# Patient Record
Sex: Female | Born: 1995 | Race: White | Hispanic: No | Marital: Single | Smoking: Never smoker
Health system: Southern US, Community
[De-identification: ages and names within clinical notes are randomized; demographics above are authoritative.]

## PROBLEM LIST (undated history)

## (undated) HISTORY — PX: SALPINGECTOMY: SHX328

## (undated) HISTORY — PX: ELBOW SURGERY: SHX618

---

## 2014-08-17 ENCOUNTER — Telehealth: Payer: Self-pay | Admitting: Family Medicine

## 2014-08-26 NOTE — Telephone Encounter (Signed)
Several attempts have been made to contact patient this encounter will be closed.  

## 2014-09-21 ENCOUNTER — Telehealth: Payer: Self-pay | Admitting: Family Medicine

## 2014-09-21 NOTE — Telephone Encounter (Signed)
Patient has Medicaid and mother stated that she thought we were on her Medicaid card. Patient mother advised to call social services to verify and then to call us back to schedule.

## 2014-11-19 ENCOUNTER — Telehealth: Payer: Self-pay | Admitting: Family Medicine

## 2014-11-20 NOTE — Telephone Encounter (Signed)
Patient has medicaid and we are on patients medicaid card. Patient will call us back to schedule once she gets her schedule.

## 2014-12-18 ENCOUNTER — Telehealth: Payer: Self-pay | Admitting: Family Medicine

## 2014-12-21 NOTE — Telephone Encounter (Signed)
Appointment scheduled for May 16 with Christy @ 9:10am. Patient aware to arrive 20 minutes before appointment and to make sure she brings insurance card, photo ID and and medication she may be on.

## 2015-01-25 ENCOUNTER — Ambulatory Visit (INDEPENDENT_AMBULATORY_CARE_PROVIDER_SITE_OTHER): Payer: Medicaid Other | Admitting: Family

## 2015-01-25 ENCOUNTER — Encounter (INDEPENDENT_AMBULATORY_CARE_PROVIDER_SITE_OTHER): Payer: Self-pay

## 2015-01-25 ENCOUNTER — Encounter: Payer: Self-pay | Admitting: Family

## 2015-01-25 VITALS — BP 106/71 | HR 65 | Temp 97.4°F | Ht 67.0 in | Wt 122.8 lb

## 2015-01-25 DIAGNOSIS — R110 Nausea: Secondary | ICD-10-CM | POA: Diagnosis not present

## 2015-01-25 DIAGNOSIS — M5441 Lumbago with sciatica, right side: Secondary | ICD-10-CM

## 2015-01-25 MED ORDER — ONDANSETRON HCL 4 MG PO TABS: 4.0000 mg | ORAL_TABLET | Freq: Three times a day (TID) | ORAL | Status: DC | PRN

## 2015-01-25 MED ORDER — OMEPRAZOLE 20 MG PO CPDR: 20.0000 mg | DELAYED_RELEASE_CAPSULE | Freq: Every day | ORAL | Status: DC

## 2015-01-25 MED ORDER — MELOXICAM 15 MG PO TABS: 15.0000 mg | ORAL_TABLET | Freq: Every day | ORAL | Status: DC

## 2015-01-25 NOTE — Progress Notes (Signed)
Subjective:    Patient ID: Jennifer Key, female    DOB: 02-14-1996, 19 y.o.   MRN: [id number]  Pt presents to the office today to establish care and discuss: Back Pain This is a new problem. The current episode started more than 1 year ago. The problem occurs intermittently. The problem has been waxing and waning since onset. The pain is present in the thoracic spine and lumbar spine. The quality of the pain is described as aching. The pain radiates to the right thigh and right foot. The pain is at a severity of 10/10. The pain is moderate. The symptoms are aggravated by bending and standing. Associated symptoms include leg pain. Pertinent negatives include no bladder incontinence, bowel incontinence, dysuria, headaches, numbness or tingling. She has tried ice and NSAIDs for the symptoms. The treatment provided mild relief.   Pt is complaining of intermittent nausea that has been going on for "years". Pt states it seems to be getting worse. Pt states it seems worse the in the morning and when laying down. Pt states getting up and moving around seems to help. Pt denies any heartburn.     Review of Systems  Constitutional: Negative.   HENT: Negative.   Eyes: Negative.   Respiratory: Negative.  Negative for shortness of breath.   Cardiovascular: Negative.  Negative for palpitations.  Gastrointestinal: Negative.  Negative for bowel incontinence.  Endocrine: Negative.   Genitourinary: Negative.  Negative for bladder incontinence and dysuria.  Musculoskeletal: Positive for back pain.  Neurological: Negative.  Negative for tingling, numbness and headaches.  Hematological: Negative.   Psychiatric/Behavioral: Negative.   All other systems reviewed and are negative.      Objective:   Physical Exam  Constitutional: She is oriented to person, place, and time. She appears well-developed and well-nourished. No distress.  HENT:  Head: Normocephalic and atraumatic.  Right Ear: External ear  normal.  Left Ear: External ear normal.  Nose: Nose normal.  Mouth/Throat: Oropharynx is clear and moist.  Eyes: Pupils are equal, round, and reactive to light.  Neck: Normal range of motion. Neck supple. No thyromegaly present.  Cardiovascular: Normal rate, regular rhythm, normal heart sounds and intact distal pulses.   No murmur heard. Pulmonary/Chest: Effort normal and breath sounds normal. No respiratory distress. She has no wheezes.  Abdominal: Soft. Bowel sounds are normal. She exhibits no distension. There is no tenderness.  Musculoskeletal: Normal range of motion. She exhibits no edema or tenderness.  Neurological: She is alert and oriented to person, place, and time. She has normal reflexes. No cranial nerve deficit.  Skin: Skin is warm and dry.  Psychiatric: She has a normal mood and affect. Her behavior is normal. Judgment and thought content normal.  Vitals reviewed.     BP 106/71 mmHg  Pulse 65  Temp(Src) 97.4 F (36.3 C) (Oral)  Ht _0  (1.702 m)  Wt 122 lb 12.8 oz (55.702 kg)  BMI 19.23 kg/m2     Assessment & Plan:  1. Nausea without vomiting -Avoid spicy foods -Use zofran as needed -RTO in 2 weeks- May need to put pt on long term med- Reglan? - ondansetron (ZOFRAN) 4 MG tablet; Take 1 tablet (4 mg total) by mouth every 8 (eight) hours as needed for nausea or vomiting.  Dispense: 20 tablet; Refill: 0 - omeprazole (PRILOSEC) 20 MG capsule; Take 1 capsule (20 mg total) by mouth daily.  Dispense: 30 capsule; Refill: 3 - CMP14+EGFR - H Pylori, IGM, IGG, IGA AB  2.  Right-sided low back pain with right-sided sciatica -Rest -ice -No other NSAIDs - meloxicam (MOBIC) 15 MG tablet; Take 1 tablet (15 mg total) by mouth daily.  Dispense: 30 tablet; Refill: 0 - Ambulatory referral to Chiropractic - CMP14+EGFR   Continue all meds Labs pending Health Maintenance reviewed Diet and exercise encouraged RTO 2 weeks  Evelina Dun, FNP

## 2015-01-25 NOTE — Patient Instructions (Signed)
Nausea, Adult Nausea is the feeling that you have an upset stomach or have to vomit. Nausea by itself is not likely a serious concern, but it may be an early sign of more serious medical problems. As nausea gets worse, it can lead to vomiting. If vomiting develops, there is the risk of dehydration.  CAUSES   Viral infections.  Food poisoning.  Medicines.  Pregnancy.  Motion sickness.  Migraine headaches.  Emotional distress.  Severe pain from any source.  Alcohol intoxication. HOME CARE INSTRUCTIONS  Get plenty of rest.  Ask your caregiver about specific rehydration instructions.  Eat small amounts of food and sip liquids more often.  Take all medicines as told by your caregiver. SEEK MEDICAL CARE IF:  You have not improved after 2 days, or you get worse.  You have a headache. SEEK IMMEDIATE MEDICAL CARE IF:   You have a fever.  You faint.  You keep vomiting or have blood in your vomit.  You are extremely weak or dehydrated.  You have dark or bloody stools.  You have severe chest or abdominal pain. MAKE SURE YOU:  Understand these instructions.  Will watch your condition.  Will get help right away if you are not doing well or get worse. Document Released: 10/05/2004 Document Revised: 05/22/2012 Document Reviewed: 05/10/2011 [facility]ExitCare Patient Information 2015 [place], [place]. This information is not intended to replace advice given to you by your health care provider. Make sure you discuss any questions you have with your health care provider. Back Pain, Adult Low back pain is very common. About 1 in 5 people have back pain.The cause of low back pain is rarely dangerous. The pain often gets better over time.About half of people with a sudden onset of back pain feel better in just 2 weeks. About 8 in 10 people feel better by 6 weeks.  CAUSES Some common causes of back pain include:  Strain of the muscles or ligaments supporting the spine.  Wear and tear  (degeneration) of the spinal discs.  Arthritis.  Direct injury to the back. DIAGNOSIS Most of the time, the direct cause of low back pain is not known.However, back pain can be treated effectively even when the exact cause of the pain is unknown.Answering your caregiver's questions about your overall health and symptoms is one of the most accurate ways to make sure the cause of your pain is not dangerous. If your caregiver needs more information, he or she may order lab work or imaging tests (X-rays or MRIs).However, even if imaging tests show changes in your back, this usually does not require surgery. HOME CARE INSTRUCTIONS For many people, back pain returns.Since low back pain is rarely dangerous, it is often a condition that people can learn to [facility]manageon their own.   Remain active. It is stressful on the back to sit or stand in one place. Do not sit, drive, or stand in one place for more than 30 minutes at a time. Take short walks on level surfaces as soon as pain allows.Try to increase the length of time you walk each day.  Do not stay in bed.Resting more than 1 or 2 days can delay your recovery.  Do not avoid exercise or work.Your body is made to move.It is not dangerous to be active, even though your back may hurt.Your back will likely heal faster if you return to being active before your pain is gone.  Pay attention to your body when you bend and lift. Many people have  less discomfortwhen lifting if they bend their knees, keep the load close to their bodies,and avoid twisting. Often, the most comfortable positions are those that put less stress on your recovering back.  Find a comfortable position to sleep. Use a firm mattress and lie on your side with your knees slightly bent. If you lie on your back, put a pillow under your knees.  Only take over-the-counter or prescription medicines as directed by your caregiver. Over-the-counter medicines to reduce pain and inflammation  are often the most helpful.Your caregiver may prescribe muscle relaxant drugs.These medicines help dull your pain so you can more quickly return to your normal activities and healthy exercise.  Put ice on the injured area.  Put ice in a plastic bag.  Place a towel between your skin and the bag.  Leave the ice on for 15-20 minutes, 03-04 times a day for the first 2 to 3 days. After that, ice and heat may be alternated to reduce pain and spasms.  Ask your caregiver about trying back exercises and gentle massage. This may be of some benefit.  Avoid feeling anxious or stressed.Stress increases muscle tension and can worsen back pain.It is important to recognize when you are anxious or stressed and learn ways to manage it.Exercise is a great option. SEEK MEDICAL CARE IF:  You have pain that is not relieved with rest or medicine.  You have pain that does not improve in 1 week.  You have new symptoms.  You are generally not feeling well. SEEK IMMEDIATE MEDICAL CARE IF:   You have pain that radiates from your back into your legs.  You develop new bowel or bladder control problems.  You have unusual weakness or numbness in your arms or legs.  You develop nausea or vomiting.  You develop abdominal pain.  You feel faint. Document Released: 08/28/2005 Document Revised: 02/27/2012 Document Reviewed: 12/30/2013 [facility] Patient Information 2015 [place], [place]. This information is not intended to replace advice given to you by your health care provider. Make sure you discuss any questions you have with your health care provider.

## 2015-01-27 LAB — CMP14+EGFR
A/G RATIO: 1.9 (ref 1.1–2.5)
ALBUMIN: 5 g/dL (ref 3.5–5.5)
ALK PHOS: 75 IU/L (ref 39–117)
ALT: 11 IU/L (ref 0–32)
AST: 15 IU/L (ref 0–40)
BILIRUBIN TOTAL: 0.5 mg/dL (ref 0.0–1.2)
BUN/Creatinine Ratio: 16 (ref 8–20)
BUN: 14 mg/dL (ref 6–20)
CALCIUM: 10.4 mg/dL — AB (ref 8.7–10.2)
CHLORIDE: 102 mmol/L (ref 97–108)
CO2: 25 mmol/L (ref 18–29)
CREATININE: 0.88 mg/dL (ref 0.57–1.00)
GFR calc Af Amer: 110 mL/min/{1.73_m2} (ref >59)
GFR calc non Af Amer: 96 mL/min/{1.73_m2} (ref >59)
GLOBULIN, TOTAL: 2.7 g/dL (ref 1.5–4.5)
GLUCOSE: 90 mg/dL (ref 65–99)
Potassium: 5 mmol/L (ref 3.5–5.2)
Sodium: 145 mmol/L — ABNORMAL HIGH (ref 134–144)
TOTAL PROTEIN: 7.7 g/dL (ref 6.0–8.5)

## 2015-01-27 LAB — H PYLORI, IGM, IGG, IGA AB
H Pylori IgG: <0.9 U/mL (ref 0.0–0.8)
H. pylori, IgA Abs: <9 units (ref 0.0–8.9)

## 2015-02-16 ENCOUNTER — Ambulatory Visit: Payer: Medicaid Other | Admitting: Family

## 2015-03-29 ENCOUNTER — Encounter: Payer: Self-pay | Admitting: Family Medicine

## 2015-03-29 ENCOUNTER — Encounter: Payer: Self-pay | Admitting: *Deleted

## 2015-03-29 ENCOUNTER — Ambulatory Visit (INDEPENDENT_AMBULATORY_CARE_PROVIDER_SITE_OTHER): Payer: Medicaid Other | Admitting: Family Medicine

## 2015-03-29 VITALS — BP 124/84 | HR 89 | Temp 98.2°F | Ht 67.01 in | Wt 128.0 lb

## 2015-03-29 DIAGNOSIS — L500 Allergic urticaria: Secondary | ICD-10-CM

## 2015-03-29 MED ORDER — PREDNISONE 10 MG PO TABS: ORAL_TABLET | ORAL | Status: DC

## 2015-03-29 NOTE — Progress Notes (Signed)
Subjective:    Patient ID: Jennifer Key, female    DOB: 1996-08-07, 19 y.o.   MRN: [id number]  HPI  Patient here today for a rash that was first noticed yesterday morning. The rash itches and burns. There are no known exposure to allergens and it is confined to her anterior abdomen from the waist to just below the breasts. It is bilateral      There are no active problems to display for this patient.  Outpatient Encounter Prescriptions as of 03/29/2015  Medication Sig  . ondansetron (ZOFRAN) 4 MG tablet Take 1 tablet (4 mg total) by mouth every 8 (eight) hours as needed for nausea or vomiting.  . [DISCONTINUED] meloxicam (MOBIC) 15 MG tablet Take 1 tablet (15 mg total) by mouth daily.  . [DISCONTINUED] omeprazole (PRILOSEC) 20 MG capsule Take 1 capsule (20 mg total) by mouth daily.   No facility-administered encounter medications on file as of 03/29/2015.      Review of Systems  Constitutional: Negative.   HENT: Negative.   Eyes: Negative.   Respiratory: Negative.   Cardiovascular: Negative.   Gastrointestinal: Negative.   Endocrine: Negative.   Genitourinary: Negative.   Musculoskeletal: Negative.   Skin: Positive for rash (itches and burns).  Allergic/Immunologic: Negative.   Neurological: Negative.   Hematological: Negative.   Psychiatric/Behavioral: Negative.        Objective:   Physical Exam  Skin: There is erythema.  Rash is generalized erythema on both sides of the abdomen with some urticarial lesions throughout the confluent erythema. It does blanch with pressure. It is warm to touch. There is no rash elsewhere on the body   BP 124/84 mmHg  Pulse 89  Temp(Src) 98.2 F (36.8 C) (Oral)  Ht 5' 7.01" (1.702 m)  Wt 128 lb (58.06 kg)  BMI 20.04 kg/m2  LMP 03/22/2015        Assessment & Plan:  1. Allergic urticaria Unknown allergen will treat with anti-histamine. I recommended Zyrtec once a day and will also prescribe prednisone with a taper dose 40,  30, 20, 10 mg on consecutive days. She is to let us know if rash worsens or does not resolve  Frederica Kuster MD

## 2015-03-29 NOTE — Patient Instructions (Signed)
Take prednisone as directed  Call us back if you are not seeing improvement in the next few days.

## 2015-04-02 ENCOUNTER — Ambulatory Visit (INDEPENDENT_AMBULATORY_CARE_PROVIDER_SITE_OTHER): Payer: Medicaid Other | Admitting: Family Medicine

## 2015-04-02 ENCOUNTER — Encounter: Payer: Self-pay | Admitting: Family Medicine

## 2015-04-02 VITALS — BP 114/77 | HR 83 | Temp 97.5°F | Ht 67.0 in | Wt 125.2 lb

## 2015-04-02 DIAGNOSIS — R300 Dysuria: Secondary | ICD-10-CM | POA: Diagnosis not present

## 2015-04-02 DIAGNOSIS — Z30011 Encounter for initial prescription of contraceptive pills: Secondary | ICD-10-CM

## 2015-04-02 LAB — POCT UA - MICROSCOPIC ONLY
Casts, Ur, LPF, POC: NEGATIVE
Crystals, Ur, HPF, POC: NEGATIVE
Mucus, UA: NEGATIVE
YEAST UA: NEGATIVE

## 2015-04-02 LAB — POCT URINALYSIS DIPSTICK
BILIRUBIN UA: NEGATIVE
Glucose, UA: NEGATIVE
Ketones, UA: NEGATIVE
Nitrite, UA: NEGATIVE
PROTEIN UA: NEGATIVE
Spec Grav, UA: 1.015
UROBILINOGEN UA: NEGATIVE
pH, UA: 5

## 2015-04-02 LAB — POCT URINE PREGNANCY: PREG TEST UR: NEGATIVE

## 2015-04-02 MED ORDER — NITROFURANTOIN MACROCRYSTAL 100 MG PO CAPS: 100.0000 mg | ORAL_CAPSULE | Freq: Four times a day (QID) | ORAL | Status: DC

## 2015-04-02 MED ORDER — LEVONORG-ETH ESTRAD TRIPHASIC PO TABS: 1.0000 | ORAL_TABLET | Freq: Every day | ORAL | Status: DC

## 2015-04-02 NOTE — Progress Notes (Signed)
Subjective:  Patient ID: Jennifer Key, female    DOB: 1995-12-31  Age: 19 y.o. MRN: [id number]  CC: Urinary Tract Infection and Contraception   HPI Jennifer Key presents for pressure, dysuria. Onset 4 days ago. Nausea increasing. Last Menses 7/13. Typical flow.   INtercourse 3 days ago.   Key Jennifer Key on file.   She has past surgical Key that includes Elbow surgery (Left).   Her family Key is not on file.She reports that she has never smoked. She does not have any smokeless tobacco Key on file. She reports that she does not drink alcohol or use illicit drugs.  Outpatient Prescriptions Prior to Visit  Medication Sig Dispense Refill  . ondansetron (ZOFRAN) 4 MG tablet Take 1 tablet (4 mg total) by mouth every 8 (eight) hours as needed for nausea or vomiting. 20 tablet 0  . predniSONE (DELTASONE) 10 MG tablet TAPER- 1 tab by mouth four times daily for 1 day, 1 tab by mouth three times daily for 1 day, 1 tab by mouth twice a day for 1 day, then 1 tab by mouth daily for 1 day, then stop. (Patient not taking: Reported on 04/02/2015) 10 tablet 0   No facility-administered medications prior to visit.    ROS Review of Systems  Constitutional: Negative for fever, chills, diaphoresis, appetite change and fatigue.  HENT: Negative for congestion, ear pain, hearing loss, postnasal drip, rhinorrhea, sore throat and trouble swallowing.   Respiratory: Negative for cough, chest tightness and shortness of breath.   Cardiovascular: Negative for chest pain and palpitations.  Gastrointestinal: Negative for abdominal pain.  Genitourinary: Positive for dysuria and frequency.  Musculoskeletal: Negative for arthralgias.  Skin: Negative for rash.    Objective:  BP 114/77 mmHg  Pulse 83  Temp(Src) 97.5 F (36.4 C) (Oral)  Ht 5\' 7"  (1.702 m)  Wt 125 lb 3.2 oz (56.79 kg)  BMI 19.60 kg/m2  LMP 03/22/2015  BP Readings from Last 3 Encounters:    04/02/15 114/77  03/29/15 124/84  01/25/15 106/71    Wt Readings from Last 3 Encounters:  04/02/15 125 lb 3.2 oz (56.79 kg) (45 %*, Z = -0.13)  03/29/15 128 lb (58.06 kg) (50 %*, Z = 0.00)  01/25/15 122 lb 12.8 oz (55.702 kg) (41 %*, Z = -0.24)   * Growth percentiles are based on CDC 2-20 Years data.     Physical Exam  Constitutional: She is oriented to person, place, and time. She appears well-developed and well-nourished. No distress.  HENT:  Head: Normocephalic and atraumatic.  Eyes: Conjunctivae are normal. Pupils are equal, round, and reactive to light.  Neck: Normal range of motion. Neck supple. No thyromegaly present.  Cardiovascular: Normal rate, regular rhythm and normal heart sounds.   No murmur heard. Pulmonary/Chest: Effort normal and breath sounds normal. No respiratory distress. She has no wheezes. She has no rales.  Abdominal: Soft. Bowel sounds are normal. She exhibits no distension. There is no tenderness.  Musculoskeletal: Normal range of motion.  Lymphadenopathy:    She has no cervical adenopathy.  Neurological: She is alert and oriented to person, place, and time.  Skin: Skin is warm and dry.  Psychiatric: She has a normal mood and affect. Her behavior is normal. Judgment and thought content normal.    No results found for: HGBA1C  Lab Results  Component Value Date   GLUCOSE 90 01/25/2015   ALT 11 01/25/2015   AST 15 01/25/2015   NA 145*  01/25/2015   K 5.0 01/25/2015   CL 102 01/25/2015   CREATININE 0.88 01/25/2015   BUN 14 01/25/2015   CO2 25 01/25/2015    Patient was never admitted.  Assessment & Plan:   Jennifer Key was seen today for urinary tract infection and contraception.  Diagnoses and all orders for this visit:  Dysuria Orders: -     POCT urinalysis dipstick -     POCT UA - Microscopic Only -     Urine culture -     GC/Chlamydia Probe Amp -     Hepatitis C antibody -     HIV antibody -     RPR  Encounter for initial  prescription of contraceptive pills Orders: -     POCT urine pregnancy -     GC/Chlamydia Probe Amp -     Hepatitis C antibody -     HIV antibody -     RPR  Other orders -     levonorgestrel-ethinyl estradiol (ENPRESSE,TRIVORA) tablet; Take 1 tablet by mouth daily. -     nitrofurantoin (MACRODANTIN) 100 MG capsule; Take 1 capsule (100 mg total) by mouth 4 (four) times daily.   I have discontinued Jennifer Key's predniSONE. I am also having her start on levonorgestrel-ethinyl estradiol and nitrofurantoin. Additionally, I am having her maintain her ondansetron.  Meds ordered this encounter  Medications  . levonorgestrel-ethinyl estradiol (ENPRESSE,TRIVORA) tablet    Sig: Take 1 tablet by mouth daily.    Dispense:  1 Package    Refill:  2  . nitrofurantoin (MACRODANTIN) 100 MG capsule    Sig: Take 1 capsule (100 mg total) by mouth 4 (four) times daily.    Dispense:  14 capsule    Refill:  0     Follow-up: Return in about 3 months (around 07/03/2015).  Mechele Claude, M.D.

## 2015-04-03 LAB — RPR: RPR: NONREACTIVE

## 2015-04-03 LAB — HEPATITIS C ANTIBODY

## 2015-04-03 LAB — HIV ANTIBODY (ROUTINE TESTING W REFLEX): HIV Screen 4th Generation wRfx: NONREACTIVE

## 2015-04-04 LAB — URINE CULTURE

## 2015-04-05 ENCOUNTER — Telehealth: Payer: Self-pay | Admitting: *Deleted

## 2015-04-05 NOTE — Telephone Encounter (Signed)
-----   Message from Mechele Claude, MD sent at 04/04/2015  8:55 PM EDT ----- Deland Pretty, Your urine culture grew a common germ that should respond well to the antibiotic he were prescribed. Best Regards, Mechele Claude, M.D.

## 2015-04-05 NOTE — Telephone Encounter (Signed)
Pt notified of results Verbalizes understanding 

## 2015-04-06 LAB — GC/CHLAMYDIA PROBE AMP
CHLAMYDIA, DNA PROBE: NEGATIVE
NEISSERIA GONORRHOEAE BY PCR: NEGATIVE

## 2015-04-07 ENCOUNTER — Ambulatory Visit (INDEPENDENT_AMBULATORY_CARE_PROVIDER_SITE_OTHER): Payer: Medicaid Other | Admitting: Family

## 2015-04-07 ENCOUNTER — Encounter: Payer: Self-pay | Admitting: Family

## 2015-04-07 VITALS — BP 112/71 | HR 69 | Temp 97.5°F | Ht 67.0 in | Wt 122.0 lb

## 2015-04-07 DIAGNOSIS — R112 Nausea with vomiting, unspecified: Secondary | ICD-10-CM | POA: Diagnosis not present

## 2015-04-07 NOTE — Patient Instructions (Signed)

## 2015-04-07 NOTE — Progress Notes (Signed)
   Subjective:    Patient ID: Jennifer Key, female    DOB: [birth year], 19 y.o.   MRN: [id number]  Emesis  This is a new problem. The current episode started today. The problem occurs 5 to 10 times per day. The problem has been rapidly improving. There has been no fever. Associated symptoms include chills and sweats. Pertinent negatives include no arthralgias, coughing, fever or headaches. Treatments tried: zofran. The treatment provided mild relief.      Review of Systems  Constitutional: Positive for chills. Negative for fever.  HENT: Negative.   Eyes: Negative.   Respiratory: Negative.  Negative for cough and shortness of breath.   Cardiovascular: Negative.  Negative for palpitations.  Gastrointestinal: Positive for vomiting.  Endocrine: Negative.   Genitourinary: Negative.   Musculoskeletal: Negative.  Negative for arthralgias.  Neurological: Negative.  Negative for headaches.  Hematological: Negative.   Psychiatric/Behavioral: Negative.   All other systems reviewed and are negative.      Objective:   Physical Exam  Constitutional: She is oriented to person, place, and time. She appears well-developed and well-nourished. No distress.  HENT:  Head: Normocephalic and atraumatic.  Eyes: Pupils are equal, round, and reactive to light.  Neck: Normal range of motion. Neck supple. No thyromegaly present.  Cardiovascular: Normal rate, regular rhythm, normal heart sounds and intact distal pulses.   No murmur heard. Pulmonary/Chest: Effort normal and breath sounds normal. No respiratory distress. She has no wheezes.  Abdominal: Soft. Bowel sounds are normal. She exhibits no distension. There is no tenderness.  Musculoskeletal: Normal range of motion. She exhibits no edema or tenderness.  Neurological: She is alert and oriented to person, place, and time. She has normal reflexes. No cranial nerve deficit.  Skin: Skin is warm and dry.  Psychiatric: She has a normal mood and affect.  Her behavior is normal. Judgment and thought content normal.  Vitals reviewed.     BP 112/71 mmHg  Pulse 69  Temp(Src) 97.5 F (36.4 C) (Oral)  Ht  (1.702 m)  Wt 122 lb (55.339 kg)  BMI 19.10 kg/m2  LMP 03/22/2015     Assessment & Plan:  1. Non-intractable vomiting with nausea, vomiting of unspecified type -Use zofran as needed -Force fluids -Bland diet -PT states she is "much better at this time" -RTO as needed or if s/s worsen  Jannifer Rodney, FNP

## 2015-04-08 ENCOUNTER — Encounter: Payer: Self-pay | Admitting: Family Medicine

## 2015-04-08 ENCOUNTER — Ambulatory Visit (INDEPENDENT_AMBULATORY_CARE_PROVIDER_SITE_OTHER): Payer: Medicaid Other | Admitting: Family Medicine

## 2015-04-08 VITALS — BP 116/73 | HR 68 | Temp 98.2°F | Ht 67.0 in | Wt 123.8 lb

## 2015-04-08 DIAGNOSIS — N898 Other specified noninflammatory disorders of vagina: Secondary | ICD-10-CM

## 2015-04-08 DIAGNOSIS — N760 Acute vaginitis: Secondary | ICD-10-CM

## 2015-04-08 DIAGNOSIS — A499 Bacterial infection, unspecified: Secondary | ICD-10-CM

## 2015-04-08 DIAGNOSIS — R112 Nausea with vomiting, unspecified: Secondary | ICD-10-CM

## 2015-04-08 DIAGNOSIS — B9689 Other specified bacterial agents as the cause of diseases classified elsewhere: Secondary | ICD-10-CM

## 2015-04-08 LAB — POCT WET PREP (WET MOUNT): KOH Wet Prep POC: POSITIVE

## 2015-04-08 NOTE — Progress Notes (Signed)
Subjective:  Patient ID: Jennifer Key, female    DOB: 1995-12-11  Age: 19 y.o. MRN: [id number]  CC: nausea with vomiting and vaginal irritation   HPI Jennifer Key presents for onset 3 days ago. Not on menses. Hasn't started OC yet. Sexually active last week. Mild vaginal DC. No odor  History Jennifer Key has no past medical history on file.   She has past surgical history that includes Elbow surgery (Left).   Her family history is not on file.She reports that she has never smoked. She does not have any smokeless tobacco history on file. She reports that she does not drink alcohol or use illicit drugs.  Outpatient Prescriptions Prior to Visit  Medication Sig Dispense Refill  . ondansetron (ZOFRAN) 4 MG tablet Take 1 tablet (4 mg total) by mouth every 8 (eight) hours as needed for nausea or vomiting. 20 tablet 0  . levonorgestrel-ethinyl estradiol (ENPRESSE,TRIVORA) tablet Take 1 tablet by mouth daily. (Patient not taking: Reported on 04/07/2015) 1 Package 2   No facility-administered medications prior to visit.    ROS Review of Systems  Constitutional: Negative for fever, chills, diaphoresis, appetite change, fatigue and unexpected weight change.  HENT: Negative for congestion, ear pain, hearing loss, postnasal drip, rhinorrhea, sneezing, sore throat and trouble swallowing.   Eyes: Negative for pain.  Respiratory: Negative for cough, chest tightness and shortness of breath.   Cardiovascular: Negative for chest pain and palpitations.  Gastrointestinal: Positive for nausea and vomiting. Negative for abdominal pain, diarrhea and constipation.  Genitourinary: Positive for vaginal discharge and vaginal pain. Negative for dysuria and frequency.  Musculoskeletal: Negative for joint swelling and arthralgias.  Skin: Negative for rash.  Neurological: Negative for dizziness, weakness, numbness and headaches.  Psychiatric/Behavioral: Negative for dysphoric mood and agitation.     Objective:  BP 116/73 mmHg  Pulse 68  Temp(Src) 98.2 F (36.8 C) (Oral)  Ht 5\' 7"  (1.702 m)  Wt 123 lb 12.8 oz (56.155 kg)  BMI 19.39 kg/m2  LMP 03/22/2015  BP Readings from Last 3 Encounters:  04/08/15 116/73  04/07/15 112/71  04/02/15 114/77    Wt Readings from Last 3 Encounters:  04/08/15 123 lb 12.8 oz (56.155 kg) (42 %*, Z = -0.20)  04/07/15 122 lb (55.339 kg) (38 %*, Z = -0.30)  04/02/15 125 lb 3.2 oz (56.79 kg) (45 %*, Z = -0.13)   * Growth percentiles are based on CDC 2-20 Years data.     Physical Exam  Constitutional: She is oriented to person, place, and time. She appears well-developed and well-nourished. No distress.  HENT:  Head: Normocephalic and atraumatic.  Right Ear: External ear normal.  Left Ear: External ear normal.  Nose: Nose normal.  Mouth/Throat: Oropharynx is clear and moist.  Eyes: Conjunctivae and EOM are normal. Pupils are equal, round, and reactive to light.  Neck: Normal range of motion. Neck supple. No thyromegaly present.  Cardiovascular: Normal rate, regular rhythm and normal heart sounds.   No murmur heard. Pulmonary/Chest: Effort normal and breath sounds normal. No respiratory distress. She has no wheezes. She has no rales.  Abdominal: Soft. Bowel sounds are normal. She exhibits no distension. There is no tenderness.  Lymphadenopathy:    She has no cervical adenopathy.  Neurological: She is alert and oriented to person, place, and time. She has normal reflexes.  Skin: Skin is warm and dry.  Psychiatric: She has a normal mood and affect. Her behavior is normal. Judgment and thought content normal.    No  results found for: HGBA1C  Lab Results  Component Value Date   GLUCOSE 90 01/25/2015   ALT 11 01/25/2015   AST 15 01/25/2015   NA 145* 01/25/2015   K 5.0 01/25/2015   CL 102 01/25/2015   CREATININE 0.88 01/25/2015   BUN 14 01/25/2015   CO2 25 01/25/2015    Patient was never admitted.  Assessment & Plan:    Jennifer Key was seen today for nausea with vomiting and vaginal irritation.  Diagnoses and all orders for this visit:  BV (bacterial vaginosis)  Nausea and vomiting, vomiting of unspecified type Orders: -     POCT UA - Microscopic Only -     POCT urinalysis dipstick -     Cancel: POCT urine pregnancy -     hCG, quantitative, pregnancy -     hCG, quantitative, pregnancy -     Ambulatory referral to Gastroenterology  Vaginal irritation Orders: -     POCT UA - Microscopic Only -     POCT urinalysis dipstick -     POCT Wet Prep ([place]et [place]) -     hCG, quantitative, pregnancy  Other orders -     clindamycin (CLEOCIN) 100 MG vaginal suppository; Place 1 suppository (100 mg total) vaginally at bedtime.   I am having Jennifer Key start on clindamycin. I am also having her maintain her ondansetron and levonorgestrel-ethinyl estradiol.  Meds ordered this encounter  Medications  . clindamycin (CLEOCIN) 100 MG vaginal suppository    Sig: Place 1 suppository (100 mg total) vaginally at bedtime.    Dispense:  3 suppository    Refill:  0     Follow-up: No Follow-up on file.  Mechele Claude, M.D.

## 2015-04-09 ENCOUNTER — Encounter: Payer: Self-pay | Admitting: Physician Assistant

## 2015-04-09 DIAGNOSIS — B9689 Other specified bacterial agents as the cause of diseases classified elsewhere: Secondary | ICD-10-CM | POA: Insufficient documentation

## 2015-04-09 DIAGNOSIS — N760 Acute vaginitis: Principal | ICD-10-CM | POA: Insufficient documentation

## 2015-04-09 LAB — BETA HCG QUANT (REF LAB): hCG Quant: <1 m[IU]/mL

## 2015-04-09 MED ORDER — CLINDAMYCIN PHOSPHATE 100 MG VA SUPP: 100.0000 mg | Freq: Every day | VAGINAL | Status: DC

## 2015-04-12 ENCOUNTER — Ambulatory Visit (INDEPENDENT_AMBULATORY_CARE_PROVIDER_SITE_OTHER): Payer: Medicaid Other | Admitting: Family

## 2015-04-12 ENCOUNTER — Encounter: Payer: Self-pay | Admitting: Family

## 2015-04-12 VITALS — BP 113/70 | HR 79 | Temp 97.6°F | Ht 67.0 in | Wt 123.8 lb

## 2015-04-12 DIAGNOSIS — R300 Dysuria: Secondary | ICD-10-CM | POA: Diagnosis not present

## 2015-04-12 DIAGNOSIS — N3000 Acute cystitis without hematuria: Secondary | ICD-10-CM | POA: Diagnosis not present

## 2015-04-12 LAB — POCT URINALYSIS DIPSTICK
BILIRUBIN UA: NEGATIVE
Glucose, UA: NEGATIVE
KETONES UA: NEGATIVE
Nitrite, UA: POSITIVE
PROTEIN UA: NEGATIVE
Spec Grav, UA: 1.015
UROBILINOGEN UA: NEGATIVE
pH, UA: 6.5

## 2015-04-12 LAB — POCT UA - MICROSCOPIC ONLY
CASTS, UR, LPF, POC: NEGATIVE
Crystals, Ur, HPF, POC: NEGATIVE
Yeast, UA: NEGATIVE

## 2015-04-12 MED ORDER — SULFAMETHOXAZOLE-TRIMETHOPRIM 800-160 MG PO TABS: 1.0000 | ORAL_TABLET | Freq: Two times a day (BID) | ORAL | Status: DC

## 2015-04-12 NOTE — Progress Notes (Signed)
   Subjective:    Patient ID: Jennifer Key, female    DOB: [birth year], 19 y.o.   MRN: [id number]  Flank Pain This is a new problem. The current episode started yesterday (Sunday). The problem occurs constantly. The problem is unchanged. The quality of the pain is described as burning. Associated symptoms include dysuria. Pertinent negatives include no fever or headaches.  Emesis  This is a recurrent problem. The current episode started today. The problem occurs less than 2 times per day. The emesis has an appearance of stomach contents. Pertinent negatives include no arthralgias, chills, diarrhea, dizziness, fever or headaches. She has tried nothing for the symptoms. The treatment provided mild relief.  Dysuria  This is a new problem. The current episode started in the past 7 days (Sunday). The problem occurs every urination. The problem has been unchanged. The pain is at a severity of 6/10. The pain is mild. Associated symptoms include flank pain, nausea and vomiting. Pertinent negatives include no chills or hematuria. She has tried nothing for the symptoms. The treatment provided no relief.      Review of Systems  Constitutional: Negative.  Negative for fever and chills.  HENT: Negative.   Eyes: Negative.   Respiratory: Negative.  Negative for shortness of breath.   Cardiovascular: Negative.  Negative for palpitations.  Gastrointestinal: Positive for nausea and vomiting. Negative for diarrhea.  Endocrine: Negative.   Genitourinary: Positive for dysuria and flank pain. Negative for hematuria.  Musculoskeletal: Negative.  Negative for arthralgias.  Neurological: Negative.  Negative for dizziness and headaches.  Hematological: Negative.   Psychiatric/Behavioral: Negative.   All other systems reviewed and are negative.      Objective:   Physical Exam  Constitutional: She is oriented to person, place, and time. She appears well-developed and well-nourished. No distress.  HENT:    Head: Normocephalic and atraumatic.  Right Ear: External ear normal.  Left Ear: External ear normal.  Nose: Nose normal.  Mouth/Throat: Oropharynx is clear and moist.  Eyes: Pupils are equal, round, and reactive to light.  Neck: Normal range of motion. Neck supple. No thyromegaly present.  Cardiovascular: Normal rate, regular rhythm, normal heart sounds and intact distal pulses.   No murmur heard. Pulmonary/Chest: Effort normal and breath sounds normal. No respiratory distress. She has no wheezes.  Abdominal: Soft. Bowel sounds are normal. She exhibits no distension. There is no tenderness.  Musculoskeletal: Normal range of motion. She exhibits no edema or tenderness.  Neurological: She is alert and oriented to person, place, and time. She has normal reflexes. No cranial nerve deficit.  Skin: Skin is warm and dry.  Psychiatric: She has a normal mood and affect. Her behavior is normal. Judgment and thought content normal.  Vitals reviewed.   BP 113/70 mmHg  Pulse 79  Temp(Src) 97.6 F (36.4 C) (Oral)  Ht  (1.702 m)  Wt 123 lb 12.8 oz (56.155 kg)  BMI 19.39 kg/m2  LMP 03/22/2015       Assessment & Plan:  1. Dysuria - POCT urinalysis dipstick - POCT UA - Microscopic Only  2. Acute cystitis without hematuria -Force fluids AZO over the counter X2 days RTO prn Culture pending - sulfamethoxazole-trimethoprim (BACTRIM DS,SEPTRA DS) 800-160 MG per tablet; Take 1 tablet by mouth 2 (two) times daily.  Dispense: 10 tablet; Refill: 0 - Urine culture  Jannifer Rodney, FNP

## 2015-04-12 NOTE — Patient Instructions (Signed)

## 2015-04-14 LAB — URINE CULTURE

## 2015-04-27 ENCOUNTER — Encounter: Payer: Self-pay | Admitting: Physician Assistant

## 2015-04-27 ENCOUNTER — Other Ambulatory Visit (INDEPENDENT_AMBULATORY_CARE_PROVIDER_SITE_OTHER): Payer: Medicaid Other

## 2015-04-27 ENCOUNTER — Ambulatory Visit (INDEPENDENT_AMBULATORY_CARE_PROVIDER_SITE_OTHER): Payer: Medicaid Other | Admitting: Physician Assistant

## 2015-04-27 VITALS — BP 94/60 | HR 64 | Ht 66.5 in | Wt 123.4 lb

## 2015-04-27 DIAGNOSIS — R112 Nausea with vomiting, unspecified: Secondary | ICD-10-CM

## 2015-04-27 DIAGNOSIS — R634 Abnormal weight loss: Secondary | ICD-10-CM | POA: Diagnosis not present

## 2015-04-27 DIAGNOSIS — N3080 Other cystitis without hematuria: Secondary | ICD-10-CM

## 2015-04-27 DIAGNOSIS — N3090 Cystitis, unspecified without hematuria: Secondary | ICD-10-CM

## 2015-04-27 LAB — CBC WITH DIFFERENTIAL/PLATELET
BASOS PCT: 0.3 % (ref 0.0–3.0)
Basophils Absolute: 0 10*3/uL (ref 0.0–0.1)
EOS ABS: 0.1 10*3/uL (ref 0.0–0.7)
EOS PCT: 1 % (ref 0.0–5.0)
HEMATOCRIT: 41.4 % (ref 36.0–49.0)
HEMOGLOBIN: 13.6 g/dL (ref 12.0–16.0)
LYMPHS PCT: 32.5 % (ref 24.0–48.0)
Lymphs Abs: 2.7 10*3/uL (ref 0.7–4.0)
MCHC: 32.9 g/dL (ref 31.0–37.0)
MCV: 87.9 fl (ref 78.0–98.0)
Monocytes Absolute: 0.5 10*3/uL (ref 0.1–1.0)
Monocytes Relative: 6.3 % (ref 3.0–12.0)
NEUTROS ABS: 4.9 10*3/uL (ref 1.4–7.7)
Neutrophils Relative %: 59.9 % (ref 43.0–71.0)
PLATELETS: 315 10*3/uL (ref 150.0–575.0)
RBC: 4.72 Mil/uL (ref 3.80–5.70)
RDW: 14.8 % (ref 11.4–15.5)
WBC: 8.2 10*3/uL (ref 4.5–13.5)

## 2015-04-27 LAB — COMPREHENSIVE METABOLIC PANEL
ALT: 9 U/L (ref 0–35)
AST: 16 U/L (ref 0–37)
Albumin: 4.7 g/dL (ref 3.5–5.2)
Alkaline Phosphatase: 72 U/L (ref 47–119)
BUN: 13 mg/dL (ref 6–23)
CHLORIDE: 104 meq/L (ref 96–112)
CO2: 28 meq/L (ref 19–32)
CREATININE: 0.86 mg/dL (ref 0.40–1.20)
Calcium: 9.8 mg/dL (ref 8.4–10.5)
GFR: 89.62 mL/min (ref >60.00)
GLUCOSE: 91 mg/dL (ref 70–99)
POTASSIUM: 4.4 meq/L (ref 3.5–5.1)
SODIUM: 139 meq/L (ref 135–145)
Total Bilirubin: 0.3 mg/dL (ref 0.2–1.2)
Total Protein: 7.8 g/dL (ref 6.0–8.3)

## 2015-04-27 LAB — LIPASE: Lipase: 32 U/L (ref 11.0–59.0)

## 2015-04-27 LAB — HIGH SENSITIVITY CRP: CRP HIGH SENSITIVITY: 1.24 mg/L (ref 0.000–5.000)

## 2015-04-27 NOTE — Patient Instructions (Signed)
Please go to the basement level to have your labs drawn.  When you are able to look at your schedule , call us back and schedule an Ultrasound. Ask for [name].   Get over the counter pepcid 20 mg, Ta,e 1 tab at bedtime for 1 month.

## 2015-04-27 NOTE — Progress Notes (Signed)
i agree with the above note, plan 

## 2015-04-27 NOTE — Progress Notes (Signed)
Patient ID: Jennifer Key, female   DOB: 08/23/1996, 19 y.o.   MRN: [id number]   Subjective:    Patient ID: Jennifer Key, female    DOB: 10/23/1995, 19 y.o.   MRN: [id number]  HPI Jennifer Key is a 19 year old white female referred by Dr. Broadus John stacks to GI today for evaluation of recurrent nausea and vomiting. Patient says she has been having problems with recurrent nausea and episodes of vomiting over the past 4 years. She says over the past year or so she is very frequently nauseated early in the morning when she wakes up and will have vomiting. She seems says these episodes seem to go in cycles where she'll feel bad for several days and then symptoms resolve and always recur. She states that at least 20 days out of the month she awakens nauseated. She is usually unable to eat in the morning because she has no appetite and often after vomiting will gradually started feeling better and then is able to eat later on in the day. She has to 2 episodes recently of sharp stabbing pain in the epigastrium which is new. Her appetite currently is fine in her weight is stable though earlier this year in the spring she had lost gradually about 30 pounds over a six-month period. She says she had a very poor appetite during that time but no abdominal pain. She denies any significant problems with anxiety currently but says she may have been anxious in the spring and she had just started a new job. Does not have any regular heartburn or indigestion symptoms. Family history is negative for GI diseases far she is where other than IBS. Previous testing for H. pylori had been done and this was negative. She also been recently diagnosed with an acute cystitis with a positive urine culture on August 1 with 10 to the fifth Escherichia coli. She says this is her second episode of cystitis. She reports that she does not want to take antibiotics because they are terrible for your body. She treated the cystitis homeopathically  with oil of  oregano and says all of her symptoms have cleared. She has not had a repeat UA or culture.  Review of Systems Pertinent positive and negative review of systems were noted in the above HPI section.  All other review of systems was otherwise negative.  Outpatient Encounter Prescriptions as of 04/27/2015  Medication Sig  . ondansetron (ZOFRAN) 4 MG tablet Take 1 tablet (4 mg total) by mouth every 8 (eight) hours as needed for nausea or vomiting.  . [DISCONTINUED] sulfamethoxazole-trimethoprim (BACTRIM DS,SEPTRA DS) 800-160 MG per tablet Take 1 tablet by mouth 2 (two) times daily. (Patient not taking: Reported on 04/27/2015)   No facility-administered encounter medications on file as of 04/27/2015.   No Known Allergies Patient Active Problem List   Diagnosis Date Noted  . Recurrent cystitis 04/27/2015  . BV (bacterial vaginosis) 04/09/2015   Social History   Social History  . Marital Status: Single    Spouse Name: N/A  . Number of Children: 0  . Years of Education: N/A   Occupational History  . grill cook    Social History Main Topics  . Smoking status: Never Smoker   . Smokeless tobacco: Never Used  . Alcohol Use: No  . Drug Use: No  . Sexual Activity: Not on file   Other Topics Concern  . Not on file   Social History Narrative    Ms. Greif's family history includes Heart disease in  her maternal grandfather and maternal grandmother; Irritable bowel syndrome in her maternal grandmother; Prostate cancer in her maternal grandfather.      Objective:    Filed Vitals:   04/27/15 0826  BP: 94/60  Pulse: 64    Physical Exam  developed young white female in no acute distress, pleasant accompanied by her mother blood pressure 94/60 pulse 64 height 5 foot 6 weight 123. HEENT; nontraumatic normocephalic EOMI PERRLA sclera anicteric, Supple; no JVD, Cardiovascular ;regular rate and rhythm with S1-S2 no murmur rub or gallop, Pulmonary; clear bilaterally, Abdomen ;soft  basically nontender is no palpable mass or hepatosplenomegaly bowel sounds are present, Rectal ;exam not done, Extremities; no clubbing cyanosis or edema skin warm and dry, Neuropsych; mood and affect appropriate       Assessment & Plan:   #1  19 yo female with fairly chronic early morning nausea and frequent am vomiting, and weight loss of 20 pounds earlier this year /unintentional Recent hpylori and BHCG negative R/O GERD, Celiac disease GB disease #2 recurrent cystitis- pt noncompliant with antibiotics to treat  Plan; Schedule upper abdominal US Start OTC pepcid 20 mg po qhs Advised she see PCP to repeat UA and culture as she did not treat  Check celiac panel, cbc cmet,crp Consider EGD     Amy Oswald Hillock PA-C 04/27/2015   Cc: Mechele Claude, MD

## 2015-04-28 LAB — CELIAC PANEL 10
Endomysial Screen: NEGATIVE
GLIADIN IGA: 2 U (ref <20)
GLIADIN IGG: 1 U (ref <20)
IgA: 214 mg/dL (ref 69–380)
TISSUE TRANSGLUT AB: 1 U/mL (ref <6)
TISSUE TRANSGLUTAMINASE AB, IGA: 1 U/mL (ref <4)

## 2015-07-05 ENCOUNTER — Ambulatory Visit (INDEPENDENT_AMBULATORY_CARE_PROVIDER_SITE_OTHER): Payer: Medicaid Other | Admitting: Pediatrics

## 2015-07-05 ENCOUNTER — Encounter: Payer: Self-pay | Admitting: Pediatrics

## 2015-07-05 VITALS — BP 101/59 | HR 83 | Temp 98.2°F | Ht 66.51 in | Wt 127.6 lb

## 2015-07-05 DIAGNOSIS — M5431 Sciatica, right side: Secondary | ICD-10-CM

## 2015-07-05 NOTE — Progress Notes (Signed)
Subjective:    Patient ID: Jennifer Key, female    DOB: 1995/10/13, 18 y.o.   MRN: [id number]  CC: sciatica  HPI: Jennifer Key is a 19 y.o. female presenting on 07/05/2015 for Back Pain  Started having a pinched feeling in her lower R back last night halfway through shift in [employer]. Woke up and felt the same, called out at work, Went back to sleep woke up at noon and feels better but not completely back to normal. Pain radiates up to middle of back and down R leg when it is at its worst such as last night. Putting weight on R leg makes it worse.  Has been coming and going for past year. Lasts for less than 24h usually. Sometimes takes aleve, usually avoids medicines. Has seen chiropractor in the past and helped a lot Was told she had mild scoliosis in the past.   Relevant past medical, surgical, family and social history reviewed and updated as indicated. Interim medical history since our last visit reviewed. Allergies and medications reviewed and updated.   ROS: Per HPI unless specifically indicated above  Past Medical History Patient Active Problem List   Diagnosis Date Noted  . Recurrent cystitis 04/27/2015  . BV (bacterial vaginosis) 04/09/2015    Current Outpatient Prescriptions  Medication Sig Dispense Refill  . ondansetron (ZOFRAN) 4 MG tablet Take 1 tablet (4 mg total) by mouth every 8 (eight) hours as needed for nausea or vomiting. 20 tablet 0   No current facility-administered medications for this visit.       Objective:    BP 101/59 mmHg  Pulse 83  Temp(Src) 98.2 F (36.8 C) (Oral)  Ht 5' 6.51" (1.689 m)  Wt 127 lb 9.6 oz (57.879 kg)  BMI 20.29 kg/m2  Wt Readings from Last 3 Encounters:  07/05/15 127 lb 9.6 oz (57.879 kg) (49 %*, Z = -0.03)  04/27/15 123 lb 6 oz (55.963 kg) (41 %*, Z = -0.23)  04/12/15 123 lb 12.8 oz (56.155 kg) (42 %*, Z = -0.20)   * Growth percentiles are based on CDC 2-20 Years data.    Gen: NAD, alert, cooperative  with exam, NCAT EYES: EOMI, no scleral injection or icterus ENT:  TMs pearly gray b/l, OP without erythema LYMPH: no cervical LAD CV: NRRR, normal S1/S2, no murmur, distal pulses 2+ b/l Resp: CTABL, no wheezes, normal WOB Abd: +BS, soft, NTND. no guarding or organomegaly Ext: No edema, warm Neuro: Alert and oriented, strength equal b/l LE with knee flex/ext, ankle flex/ext. 2+ patellar reflexes b/l. Sensation intact throughout LE. coordination grossly normal MSK: no atrophy, some elevatoin      Assessment & Plan:   Jennifer Key was seen today for back pain, Sciatica of right side. Symptoms improved now. Symptoms usually last less than 24h. Recommended she take naproxen as needed, if getting more frequent or worsening symptoms let us know. Avoid triggers.   Follow up plan: Return if symptoms worsen or fail to improve.  Rex Kras, MD [facility] 07/05/2015, 4:05 PM

## 2015-07-05 NOTE — Patient Instructions (Signed)
Use ibuprofen 600-800mg  twice a day when having the pain.

## 2015-09-23 ENCOUNTER — Ambulatory Visit (INDEPENDENT_AMBULATORY_CARE_PROVIDER_SITE_OTHER): Payer: Medicaid Other | Admitting: Family

## 2015-09-23 ENCOUNTER — Encounter: Payer: Self-pay | Admitting: Family

## 2015-09-23 VITALS — BP 105/68 | HR 80 | Temp 98.6°F | Ht 66.5 in | Wt 120.8 lb

## 2015-09-23 DIAGNOSIS — R112 Nausea with vomiting, unspecified: Secondary | ICD-10-CM

## 2015-09-23 DIAGNOSIS — R1084 Generalized abdominal pain: Secondary | ICD-10-CM

## 2015-09-23 NOTE — Progress Notes (Signed)
Subjective:    Patient ID: Jennifer Key, female    DOB: [birth year], 20 y.o.   MRN: [id number]  HPI PT presents to the office today for a referral to GI. Pt saw GI on 04/27/15 for recurrent N&V and generalized abdominal pain that has be recurrent for the last 6-7 years. PT states she was told at her GI appointment to start Pepcid and had blood work drawn. All of pt's blood work was negative. PT states she would like a second opinion and would like to go to [facility].     Review of Systems  Constitutional: Negative.   HENT: Negative.   Eyes: Negative.   Respiratory: Negative.  Negative for shortness of breath.   Cardiovascular: Negative.  Negative for palpitations.  Gastrointestinal: Negative.   Endocrine: Negative.   Genitourinary: Negative.   Musculoskeletal: Negative.   Neurological: Negative.  Negative for headaches.  Hematological: Negative.   Psychiatric/Behavioral: Negative.   All other systems reviewed and are negative.      Objective:   Physical Exam  Constitutional: She is oriented to person, place, and time. She appears well-developed and well-nourished. No distress.  HENT:  Head: Normocephalic and atraumatic.  Right Ear: External ear normal.  Left Ear: External ear normal.  Nose: Nose normal.  Mouth/Throat: Oropharynx is clear and moist.  Eyes: Pupils are equal, round, and reactive to light.  Neck: Normal range of motion. Neck supple. No thyromegaly present.  Cardiovascular: Normal rate, regular rhythm, normal heart sounds and intact distal pulses.   No murmur heard. Pulmonary/Chest: Effort normal and breath sounds normal. No respiratory distress. She has no wheezes.  Abdominal: Soft. Bowel sounds are normal. She exhibits no distension. There is no tenderness.  Musculoskeletal: Normal range of motion. She exhibits no edema or tenderness.  Neurological: She is alert and oriented to person, place, and time. She has normal reflexes. No cranial nerve deficit.    Skin: Skin is warm and dry.  Psychiatric: She has a normal mood and affect. Her behavior is normal. Judgment and thought content normal.  Vitals reviewed.   BP 105/68 mmHg  Pulse 80  Temp(Src) 98.6 F (37 C) (Oral)  Ht 5' 6.5" (1.689 m)  Wt 120 lb 12.8 oz (54.795 kg)  BMI 19.21 kg/m2       Assessment & Plan:  1. Non-intractable vomiting with nausea, vomiting of unspecified type  - Ambulatory referral to Gastroenterology  2. Generalized abdominal pain - Ambulatory referral to Gastroenterology  Continue Zofran as needed Force fluids Parke Simmers diet Keep GI referral RTO prn  Jannifer Rodney, FNP

## 2015-09-23 NOTE — Patient Instructions (Signed)
Abdominal Pain, Adult °Many things can cause abdominal pain. Usually, abdominal pain is not caused by a disease and will improve without treatment. It can often be observed and treated at home. Your health care provider will do a physical exam and possibly order blood tests and X-rays to help determine the seriousness of your pain. However, in many cases, more time must pass before a clear cause of the pain can be found. Before that point, your health care provider may not know if you need more testing or further treatment. °HOME CARE INSTRUCTIONS °Monitor your abdominal pain for any changes. The following actions may help to alleviate any discomfort you are experiencing: °· Only take over-the-counter or prescription medicines as directed by your health care provider. °· Do not take laxatives unless directed to do so by your health care provider. °· Try a clear liquid diet (broth, tea, or water) as directed by your health care provider. Slowly move to a bland diet as tolerated. °SEEK MEDICAL CARE IF: °· You have unexplained abdominal pain. °· You have abdominal pain associated with nausea or diarrhea. °· You have pain when you urinate or have a bowel movement. °· You experience abdominal pain that wakes you in the night. °· You have abdominal pain that is worsened or improved by eating food. °· You have abdominal pain that is worsened with eating fatty foods. °· You have a fever. °SEEK IMMEDIATE MEDICAL CARE IF: °· Your pain does not go away within 2 hours. °· You keep throwing up (vomiting). °· Your pain is felt only in portions of the abdomen, such as the right side or the left lower portion of the abdomen. °· You pass bloody or black tarry stools. °MAKE SURE YOU: °· Understand these instructions. °· Will watch your condition. °· Will get help right away if you are not doing well or get worse. °  °This information is not intended to replace advice given to you by your health care provider. Make sure you discuss  any questions you have with your health care provider. °  °Document Released: 06/07/2005 Document Revised: 05/19/2015 Document Reviewed: 05/07/2013 °Elsevier Interactive Patient Education ©2016 Elsevier Inc[employer] ° °Nausea and Vomiting °Nausea is a sick feeling that often comes before throwing up (vomiting). Vomiting is a reflex where stomach contents come out of your mouth. Vomiting can cause severe loss of body fluids (dehydration). Children and elderly adults can become dehydrated quickly, especially if they also have diarrhea. Nausea and vomiting are symptoms of a condition or disease. It is important to find the cause of your symptoms. °CAUSES  °· Direct irritation of the stomach lining. This irritation can result from increased acid production (gastroesophageal reflux disease), infection, food poisoning, taking certain medicines (such as nonsteroidal anti-inflammatory drugs), alcohol use, or tobacco use. °· Signals from the brain. These signals could be caused by a headache, heat exposure, an inner ear disturbance, increased pressure in the brain from injury, infection, a tumor, or a concussion, pain, emotional stimulus, or metabolic problems. °· An obstruction in the gastrointestinal tract (bowel obstruction). °· Illnesses such as diabetes, hepatitis, gallbladder problems, appendicitis, kidney problems, cancer, sepsis, atypical symptoms of a heart attack, or eating disorders. °· Medical treatments such as chemotherapy and radiation. °· Receiving medicine that makes you sleep (general anesthetic) during surgery. °DIAGNOSIS °Your caregiver may ask for tests to be done if the problems do not improve after a few days. Tests may also be done if symptoms are severe or if the reason for the   nausea and vomiting is not clear. Tests may include: °· Urine tests. °· Blood tests. °· Stool tests. °· Cultures (to look for evidence of infection). °· X-rays or other imaging studies. °Test results can help your caregiver make  decisions about treatment or the need for additional tests. °TREATMENT °You need to stay well hydrated. Drink frequently but in small amounts. You may wish to drink water, sports drinks, clear broth, or eat frozen ice pops or gelatin dessert to help stay hydrated. When you eat, eating slowly may help prevent nausea. There are also some antinausea medicines that may help prevent nausea. °HOME CARE INSTRUCTIONS  °· Take all medicine as directed by your caregiver. °· If you do not have an appetite, do not force yourself to eat. However, you must continue to drink fluids. °· If you have an appetite, eat a normal diet unless your caregiver tells you differently. °¨ Eat a variety of complex carbohydrates (rice, wheat, potatoes, bread), lean meats, yogurt, fruits, and vegetables. °¨ Avoid high-fat foods because they are more difficult to digest. °· Drink enough water and fluids to keep your urine clear or pale yellow. °· If you are dehydrated, ask your caregiver for specific rehydration instructions. Signs of dehydration may include: °¨ Severe thirst. °¨ Dry lips and mouth. °¨ Dizziness. °¨ Dark urine. °¨ Decreasing urine frequency and amount. °¨ Confusion. °¨ Rapid breathing or pulse. °SEEK IMMEDIATE MEDICAL CARE IF:  °· You have blood or brown flecks (like coffee grounds) in your vomit. °· You have black or bloody stools. °· You have a severe headache or stiff neck. °· You are confused. °· You have severe abdominal pain. °· You have chest pain or trouble breathing. °· You do not urinate at least once every 8 hours. °· You develop cold or clammy skin. °· You continue to vomit for longer than 24 to 48 hours. °· You have a fever. °MAKE SURE YOU:  °· Understand these instructions. °· Will watch your condition. °· Will get help right away if you are not doing well or get worse. °  °This information is not intended to replace advice given to you by your health care provider. Make sure you discuss any questions you have with  your health care provider. °  °Document Released: 08/28/2005 Document Revised: 11/20/2011 Document Reviewed: 01/25/2011 °Elsevier Interactive Patient Education ©2016 Elsevier Inc. ° °

## 2015-10-22 ENCOUNTER — Encounter: Payer: Self-pay | Admitting: Family

## 2015-10-22 ENCOUNTER — Ambulatory Visit (INDEPENDENT_AMBULATORY_CARE_PROVIDER_SITE_OTHER): Payer: Medicaid Other | Admitting: Family

## 2015-10-22 VITALS — BP 101/62 | HR 81 | Temp 99.2°F | Ht 66.5 in | Wt 120.0 lb

## 2015-10-22 DIAGNOSIS — N946 Dysmenorrhea, unspecified: Secondary | ICD-10-CM

## 2015-10-22 DIAGNOSIS — N926 Irregular menstruation, unspecified: Secondary | ICD-10-CM

## 2015-10-22 NOTE — Progress Notes (Signed)
   Subjective:    Patient ID: Jennifer Key, female    DOB: 11-28-1995, 20 y.o.   MRN: [id number]  HPI PT presents to the office today with painful menstrual cramps. PT states over the last few months her periods have been 1-2 weeks later than normal. PT states before September her periods were almost always 28 days. But have started becoming 5 to 6 weeks in between her periods. Pt states the last few weeks she has had intermittent cramping that was a 7-8 out 10. PT states she has had cramps in the past but they were 6 out 10. Pt states she has heavy periods with going thru 2-3 ultra tampons a day. PT states she is worried about endometriosis because this "runs in my family". PT states she has been on birth control in the past, but does not want to be on anything at this time.0   Review of Systems  Constitutional: Negative.   HENT: Negative.   Eyes: Negative.   Respiratory: Negative.  Negative for shortness of breath.   Cardiovascular: Negative.  Negative for palpitations.  Gastrointestinal: Negative.   Endocrine: Negative.   Genitourinary: Negative.   Musculoskeletal: Negative.   Neurological: Negative.  Negative for headaches.  Hematological: Negative.   Psychiatric/Behavioral: Negative.   All other systems reviewed and are negative.      Objective:   Physical Exam  Constitutional: She is oriented to person, place, and time. She appears well-developed and well-nourished. No distress.  HENT:  Head: Normocephalic and atraumatic.  Eyes: Pupils are equal, round, and reactive to light.  Neck: Normal range of motion. Neck supple. No thyromegaly present.  Cardiovascular: Normal rate, regular rhythm, normal heart sounds and intact distal pulses.   No murmur heard. Pulmonary/Chest: Effort normal and breath sounds normal. No respiratory distress. She has no wheezes.  Abdominal: Soft. Bowel sounds are normal. She exhibits no distension. There is no tenderness.  Musculoskeletal: Normal  range of motion. She exhibits no edema or tenderness.  Neurological: She is alert and oriented to person, place, and time. She has normal reflexes. No cranial nerve deficit.  Skin: Skin is warm and dry.  Psychiatric: She has a normal mood and affect. Her behavior is normal. Judgment and thought content normal.  Vitals reviewed.   BP 101/62 mmHg  Pulse 81  Temp(Src) 99.2 F (37.3 C) (Oral)  Ht 5' 6.5" (1.689 m)  Wt 120 lb (54.432 kg)  BMI 19.08 kg/m2  LMP 10/22/2015       Assessment & Plan:  1. Irregular menstrual cycle - US Transvaginal Non-OB; Future - US Abdomen Complete; Future  2. Dysmenorrhea - US Transvaginal Non-OB; Future - US Abdomen Complete; Future  Ultrasound pending to rule out endometriosis Tylenol or motrin prn for pain Keep appt with GI RTO prn  Jannifer Rodney, FNP

## 2015-10-22 NOTE — Patient Instructions (Signed)

## 2015-10-22 NOTE — Progress Notes (Signed)
   Subjective:    Patient ID: Jennifer Key, female    DOB: [birth year], 20 y.o.   MRN: [id number]  HPI    Review of Systems     Objective:   Physical Exam        Assessment & Plan:

## 2015-10-28 ENCOUNTER — Ambulatory Visit (HOSPITAL_COMMUNITY): Admission: RE | Admit: 2015-10-28 | Payer: Medicaid Other | Source: Ambulatory Visit

## 2015-10-28 ENCOUNTER — Ambulatory Visit (HOSPITAL_COMMUNITY)
Admission: RE | Admit: 2015-10-28 | Discharge: 2015-10-28 | Disposition: A | Discharge status: Home / Self Care | Payer: Medicaid Other | Source: Ambulatory Visit | Attending: Family | Admitting: Family

## 2015-10-28 DIAGNOSIS — N926 Irregular menstruation, unspecified: Secondary | ICD-10-CM | POA: Diagnosis not present

## 2015-10-28 DIAGNOSIS — N946 Dysmenorrhea, unspecified: Secondary | ICD-10-CM | POA: Diagnosis present

## 2015-12-16 ENCOUNTER — Ambulatory Visit (INDEPENDENT_AMBULATORY_CARE_PROVIDER_SITE_OTHER): Payer: Medicaid Other | Admitting: Nurse Practitioner

## 2015-12-16 ENCOUNTER — Encounter: Payer: Self-pay | Admitting: Nurse Practitioner

## 2015-12-16 VITALS — BP 124/88 | HR 80 | Temp 97.8°F | Ht 67.0 in | Wt 123.0 lb

## 2015-12-16 DIAGNOSIS — R112 Nausea with vomiting, unspecified: Secondary | ICD-10-CM

## 2015-12-16 DIAGNOSIS — K5900 Constipation, unspecified: Secondary | ICD-10-CM | POA: Diagnosis not present

## 2015-12-16 MED ORDER — ONDANSETRON HCL 4 MG PO TABS: 4.0000 mg | ORAL_TABLET | Freq: Three times a day (TID) | ORAL | Status: DC | PRN

## 2015-12-16 NOTE — Patient Instructions (Signed)

## 2015-12-16 NOTE — Progress Notes (Signed)
   Subjective:    Patient ID: Jennifer Key, female    DOB: 09/12/1995, 20 y.o.   MRN: [id number]  HPI  Patient comes in today c/o nausea- has been waking up in the mornings feeling sick and nauseaous- no vomiting. LMP 2 weeks ago- was normal. Last BM as 3-4 days ago   Review of Systems  Constitutional: Positive for appetite change (decreased).  HENT: Negative.   Respiratory: Negative.   Cardiovascular: Negative.   Gastrointestinal: Positive for nausea and abdominal pain (cramping). Negative for vomiting, diarrhea and constipation.  Genitourinary: Negative.   Neurological: Negative.   Psychiatric/Behavioral: Negative.   All other systems reviewed and are negative.      Objective:   Physical Exam  Constitutional: She is oriented to person, place, and time. She appears well-developed and well-nourished. No distress.  Cardiovascular: Normal rate, regular rhythm and normal heart sounds.   Pulmonary/Chest: Effort normal and breath sounds normal.  Abdominal: Soft. Bowel sounds are normal. There is tenderness (mild diffuse lower abd tnederness).  Neurological: She is alert and oriented to person, place, and time.  Skin: Skin is warm.  Psychiatric: She has a normal mood and affect. Her behavior is normal. Judgment and thought content normal.   BP 124/88 mmHg  Pulse 80  Temp(Src) 97.8 F (36.6 C) (Oral)  Ht 5\' 7"  (1.702 m)  Wt 123 lb (55.792 kg)  BMI 19.26 kg/m2         Assessment & Plan:   1. Non-intractable vomiting with nausea, unspecified vomiting type   2. Constipation, unspecified constipation type    Meds ordered this encounter  Medications  . ondansetron (ZOFRAN) 4 MG tablet    Sig: Take 1 tablet (4 mg total) by mouth every 8 (eight) hours as needed for nausea or vomiting.    Dispense:  20 tablet    Refill:  0    Order Specific Question:  Supervising Provider    Answer:  Deborra MedinaMOORE, DONALD W [[id number]]   miralax OTC QOD to keep bowels regular Increase fiber in  diet Avoid spicy and fatty foods RTO prn  Mary-Margaret Daphine DeutscherMartin, FNP

## 2015-12-18 ENCOUNTER — Encounter: Payer: Self-pay | Admitting: Family Medicine

## 2015-12-18 ENCOUNTER — Ambulatory Visit (INDEPENDENT_AMBULATORY_CARE_PROVIDER_SITE_OTHER): Payer: Medicaid Other | Admitting: Family Medicine

## 2015-12-18 VITALS — BP 106/73 | HR 82 | Temp 97.6°F | Ht 67.0 in | Wt 121.5 lb

## 2015-12-18 DIAGNOSIS — R112 Nausea with vomiting, unspecified: Secondary | ICD-10-CM

## 2015-12-18 DIAGNOSIS — R109 Unspecified abdominal pain: Secondary | ICD-10-CM | POA: Diagnosis not present

## 2015-12-18 NOTE — Progress Notes (Signed)
   HPI  Patient presents today here with nausea and vomiting.  Patient explains that she has a long history of morning nausea and vomiting. However over the last week it lasted longer than usual. Her usual is 2-3 hours of nausea with intermittent vomiting occasionally. She's been worked up extensively by GI and [place].  She denies fever, chills, sweats.  She has recently stopped birth control and is sexually active, however uses condoms.  Her LMP was 318.  She describes 6-7 hours of nausea that occurs first thing in the morning, she can tolerate food and fluids in the latter half of this, she is tolerating fluids during nausea. She has occasional episodes of emesis containing only stomach contents, no blood or bile. No hematochezia or melena. No diarrhea.  She denies smoking cigarettes and smoking marijuana. She denies any other drug use or medication use besides Zofran.  Describes crampy abdominal pain generalized  PMH: Smoking status noted ROS: Per HPI  Objective: BP 106/73 mmHg  Pulse 82  Temp(Src) 97.6 F (36.4 C) (Oral)  Ht 5\' 7"  (1.702 m)  Wt 121 lb 8 oz (55.112 kg)  BMI 19.03 kg/m2  LMP 11/28/2015 (Exact Date) Gen: NAD, alert, cooperative with exam HEENT: NCAT, MMM, TMs normal bilaterally CV: RRR, good S1/S2, no murmur Resp: CTABL, no wheezes, non-labored Abd: Soft, mild tenderness throughout, positive bowel sounds Ext: No edema, warm Neuro: Alert and oriented, No gross deficits  Assessment and plan:  # Nausea and vomiting Long-standing chronic nausea and vomiting being actively evaluated by GI, worsen usual She's tolerating food and fluid and is well hydrated today Zofran is helping, however not as good as usual. Reassurance provided, and reasons to return for care were seek emergency medical care have been reviewed and she understands these. Note written for work, she's missed the last 3 days. Return to clinic with any  concerns or failure to improve as expected   Urinalysis and urine pregnancy test negative today.   Murtis SinkSam Lionardo Haze, MD [facility] 12/18/2015, 11:53 AM

## 2015-12-18 NOTE — Patient Instructions (Signed)
Great to meet you!  Come back with any worsening symptoms or if you are not getting better in 3-4 days

## 2015-12-20 ENCOUNTER — Encounter: Payer: Self-pay | Admitting: Family Medicine

## 2015-12-20 ENCOUNTER — Encounter (INDEPENDENT_AMBULATORY_CARE_PROVIDER_SITE_OTHER): Payer: Self-pay

## 2015-12-20 ENCOUNTER — Ambulatory Visit (INDEPENDENT_AMBULATORY_CARE_PROVIDER_SITE_OTHER): Payer: Medicaid Other

## 2015-12-20 ENCOUNTER — Ambulatory Visit (INDEPENDENT_AMBULATORY_CARE_PROVIDER_SITE_OTHER): Payer: Medicaid Other | Admitting: Family Medicine

## 2015-12-20 VITALS — BP 110/65 | HR 71 | Temp 98.7°F | Ht 67.0 in | Wt 121.4 lb

## 2015-12-20 DIAGNOSIS — R1084 Generalized abdominal pain: Secondary | ICD-10-CM

## 2015-12-20 DIAGNOSIS — R112 Nausea with vomiting, unspecified: Secondary | ICD-10-CM

## 2015-12-20 DIAGNOSIS — K5901 Slow transit constipation: Secondary | ICD-10-CM | POA: Diagnosis not present

## 2015-12-20 LAB — PREGNANCY, URINE: PREG TEST UR: NEGATIVE

## 2015-12-20 LAB — URINALYSIS
BILIRUBIN UA: NEGATIVE
GLUCOSE, UA: NEGATIVE
KETONES UA: NEGATIVE
Leukocytes, UA: NEGATIVE
Nitrite, UA: NEGATIVE
Protein, UA: NEGATIVE
Specific Gravity, UA: 1.02 (ref 1.005–1.030)
UUROB: 0.2 mg/dL (ref 0.2–1.0)
pH, UA: 7 (ref 5.0–7.5)

## 2015-12-20 MED ORDER — POLYETHYLENE GLYCOL 3350 17 GM/SCOOP PO POWD: 17.0000 g | Freq: Two times a day (BID) | ORAL | Status: DC | PRN

## 2015-12-20 MED ORDER — ONDANSETRON 8 MG PO TBDP: 8.0000 mg | ORAL_TABLET | Freq: Four times a day (QID) | ORAL | Status: DC | PRN

## 2015-12-20 NOTE — Progress Notes (Signed)
Subjective:  Patient ID: Jennifer Key, female    DOB: 1996/05/22  Age: 20 y.o. MRN: [id number]  CC: GI upset   HPI Jayliani Wanner presents for 6 days of nausea. Awakens with it. Slowly improves through the day, then returns before bedtime. Has some periumbilical cramping intermittently as well. Denies constipation, diarrhea. Has vomited on two occasions. Menses regular. Pt. Undergoing w/u with GI. Has a planned abd MRI within the next few weeks.    History Daleisa has no past medical history on file.   She has past surgical history that includes Elbow surgery (Left).   Her family history includes Heart disease in her maternal grandfather and maternal grandmother; Irritable bowel syndrome in her maternal grandmother; Prostate cancer in her maternal grandfather.She reports that she has never smoked. She has never used smokeless tobacco. She reports that she does not drink alcohol or use illicit drugs.    ROS Review of Systems  Constitutional: Negative for fever, activity change and appetite change.  HENT: Negative for congestion, rhinorrhea and sore throat.   Eyes: Negative for visual disturbance.  Respiratory: Negative for cough and shortness of breath.   Cardiovascular: Negative for chest pain and palpitations.  Gastrointestinal: Positive for nausea, vomiting and abdominal pain. Negative for diarrhea, constipation, blood in stool, abdominal distention and anal bleeding.  Genitourinary: Negative for dysuria.  Musculoskeletal: Negative for myalgias and arthralgias.    Objective:  BP 110/65 mmHg  Pulse 71  Temp(Src) 98.7 F (37.1 C) (Oral)  Ht 5' 7"  (1.702 m)  Wt 121 lb 6.4 oz (55.067 kg)  BMI 19.01 kg/m2  SpO2 100%  LMP 11/28/2015 (Exact Date)  BP Readings from Last 3 Encounters:  12/20/15 110/65  12/18/15 106/73  12/16/15 124/88    Wt Readings from Last 3 Encounters:  12/20/15 121 lb 6.4 oz (55.067 kg)  12/18/15 121 lb 8 oz (55.112 kg)  12/16/15 123 lb  (55.792 kg)     Physical Exam  Constitutional: She is oriented to person, place, and time. She appears well-developed and well-nourished. No distress.  HENT:  Head: Normocephalic and atraumatic.  Right Ear: External ear normal.  Left Ear: External ear normal.  Nose: Nose normal.  Mouth/Throat: Oropharynx is clear and moist.  Eyes: Conjunctivae and EOM are normal. Pupils are equal, round, and reactive to light.  Neck: Normal range of motion. Neck supple. No thyromegaly present.  Cardiovascular: Normal rate, regular rhythm and normal heart sounds.   No murmur heard. Pulmonary/Chest: Effort normal and breath sounds normal. No respiratory distress. She has no wheezes. She has no rales.  Abdominal: Soft. Bowel sounds are normal. She exhibits no distension. There is no tenderness.  Lymphadenopathy:    She has no cervical adenopathy.  Neurological: She is alert and oriented to person, place, and time. She has normal reflexes.  Skin: Skin is warm and dry.  Psychiatric: She has a normal mood and affect. Her behavior is normal. Judgment and thought content normal.     Lab Results  Component Value Date   WBC 8.2 04/27/2015   HGB 13.6 04/27/2015   HCT 41.4 04/27/2015   PLT 315.0 04/27/2015   GLUCOSE 91 04/27/2015   ALT 9 04/27/2015   AST 16 04/27/2015   NA 139 04/27/2015   K 4.4 04/27/2015   CL 104 04/27/2015   CREATININE 0.86 04/27/2015   BUN 13 04/27/2015   CO2 28 04/27/2015    US Transvaginal Non-ob  10/28/2015  CLINICAL DATA:  Dysmenorrhea, menorrhagia, onset of irregular  menstrual cycle approximately 4 months ago. EXAM: TRANSABDOMINAL AND TRANSVAGINAL ULTRASOUND OF PELVIS TECHNIQUE: Both transabdominal and transvaginal ultrasound examinations of the pelvis were performed. Transabdominal technique was performed for global imaging of the pelvis including uterus, ovaries, adnexal regions, and pelvic cul-de-sac. It was necessary to proceed with endovaginal exam following the  transabdominal exam to visualize the endometrium and ovaries COMPARISON:  None in PACs FINDINGS: Uterus Measurements: 8.9 x 3.6 x 4.8 cm. No fibroids or other mass visualized. The uterus appears mildly hypervascular. Endometrium Thickness: 9.5 mm.  No focal abnormality visualized. Right ovary Measurements: 3.7 x 1.3 x 2.4 cm. Normal appearance/no adnexal mass. Left ovary Measurements: 3.0 x 1.7 x 2.4 cm. Normal appearance/no adnexal mass. Other findings There is no free pelvic fluid. IMPRESSION: 1. Normal appearance of the uterus and endometrium. The myometrium does appear mildly hypervascular. There are no discrete fibroids. If bleeding remains unresponsive to hormonal or medical therapy, sonohysterogram should be considered for focal lesion work-up. (Ref: Radiological Reasoning: Algorithmic Workup of Abnormal Vaginal Bleeding with Endovaginal Sonography and Sonohysterography. AJR 2008; 751:W25-85) 2. Normal appearance of the ovaries.  No free pelvic fluid. Electronically Signed   By: David  Martinique M.D.   On: 10/28/2015 11:29   US Pelvis Complete  10/28/2015  CLINICAL DATA:  Dysmenorrhea, menorrhagia, onset of irregular menstrual cycle approximately 4 months ago. EXAM: TRANSABDOMINAL AND TRANSVAGINAL ULTRASOUND OF PELVIS TECHNIQUE: Both transabdominal and transvaginal ultrasound examinations of the pelvis were performed. Transabdominal technique was performed for global imaging of the pelvis including uterus, ovaries, adnexal regions, and pelvic cul-de-sac. It was necessary to proceed with endovaginal exam following the transabdominal exam to visualize the endometrium and ovaries COMPARISON:  None in PACs FINDINGS: Uterus Measurements: 8.9 x 3.6 x 4.8 cm. No fibroids or other mass visualized. The uterus appears mildly hypervascular. Endometrium Thickness: 9.5 mm.  No focal abnormality visualized. Right ovary Measurements: 3.7 x 1.3 x 2.4 cm. Normal appearance/no adnexal mass. Left ovary Measurements: 3.0 x 1.7 x  2.4 cm. Normal appearance/no adnexal mass. Other findings There is no free pelvic fluid. IMPRESSION: 1. Normal appearance of the uterus and endometrium. The myometrium does appear mildly hypervascular. There are no discrete fibroids. If bleeding remains unresponsive to hormonal or medical therapy, sonohysterogram should be considered for focal lesion work-up. (Ref: Radiological Reasoning: Algorithmic Workup of Abnormal Vaginal Bleeding with Endovaginal Sonography and Sonohysterography. AJR 2008; 277:O24-23) 2. Normal appearance of the ovaries.  No free pelvic fluid. Electronically Signed   By: David  Martinique M.D.   On: 10/28/2015 11:29    Assessment & Plan:   Falecia was seen today for gi upset.  Diagnoses and all orders for this visit:  Nausea and vomiting, vomiting of unspecified type -     DG Abd 2 Views; Future -     Amylase -     CBC with Differential/Platelet -     CMP14+EGFR -     Lipase -     RPR -     Hepatitis C antibody -     HIV antibody  Generalized abdominal pain -     DG Abd 2 Views; Future -     Amylase -     CBC with Differential/Platelet -     CMP14+EGFR -     Lipase -     RPR -     Hepatitis C antibody -     HIV antibody  Slow transit constipation  Other orders -     ondansetron (ZOFRAN-ODT) 8 MG disintegrating  tablet; Take 1 tablet (8 mg total) by mouth every 6 (six) hours as needed for nausea or vomiting. -     polyethylene glycol powder (GLYCOLAX/MIRALAX) powder; Take 17 g by mouth 2 (two) times daily as needed for moderate constipation. For constipation      I am having Ms. Paradise start on ondansetron and polyethylene glycol powder. I am also having her maintain her ondansetron.  Meds ordered this encounter  Medications  . ondansetron (ZOFRAN-ODT) 8 MG disintegrating tablet    Sig: Take 1 tablet (8 mg total) by mouth every 6 (six) hours as needed for nausea or vomiting.    Dispense:  30 tablet    Refill:  1  . polyethylene glycol powder  (GLYCOLAX/MIRALAX) powder    Sig: Take 17 g by mouth 2 (two) times daily as needed for moderate constipation. For constipation    Dispense:  3350 g    Refill:  5     Follow-up: Return if symptoms worsen or fail to improve.  Claretta Fraise, M.D.

## 2015-12-21 LAB — CMP14+EGFR
ALT: 9 IU/L (ref 0–32)
AST: 13 IU/L (ref 0–40)
Albumin/Globulin Ratio: 1.7 (ref 1.2–2.2)
Albumin: 4.6 g/dL (ref 3.5–5.5)
Alkaline Phosphatase: 74 IU/L (ref 39–117)
BUN/Creatinine Ratio: 18 (ref 9–23)
BUN: 13 mg/dL (ref 6–20)
Bilirubin Total: 0.3 mg/dL (ref 0.0–1.2)
CALCIUM: 9.8 mg/dL (ref 8.7–10.2)
CHLORIDE: 100 mmol/L (ref 96–106)
CO2: 26 mmol/L (ref 18–29)
Creatinine, Ser: 0.72 mg/dL (ref 0.57–1.00)
GFR calc Af Amer: 139 mL/min/{1.73_m2} (ref >59)
GFR calc non Af Amer: 121 mL/min/{1.73_m2} (ref >59)
GLOBULIN, TOTAL: 2.7 g/dL (ref 1.5–4.5)
Glucose: 72 mg/dL (ref 65–99)
POTASSIUM: 4.5 mmol/L (ref 3.5–5.2)
SODIUM: 142 mmol/L (ref 134–144)
TOTAL PROTEIN: 7.3 g/dL (ref 6.0–8.5)

## 2015-12-21 LAB — CBC WITH DIFFERENTIAL/PLATELET
BASOS: 0 %
Basophils Absolute: 0 10*3/uL (ref 0.0–0.2)
EOS (ABSOLUTE): 0 10*3/uL (ref 0.0–0.4)
EOS: 0 %
HEMATOCRIT: 40.6 % (ref 34.0–46.6)
Hemoglobin: 13.3 g/dL (ref 11.1–15.9)
IMMATURE GRANS (ABS): 0 10*3/uL (ref 0.0–0.1)
IMMATURE GRANULOCYTES: 0 %
LYMPHS: 36 %
Lymphocytes Absolute: 3.4 10*3/uL — ABNORMAL HIGH (ref 0.7–3.1)
MCH: 29.2 pg (ref 26.6–33.0)
MCHC: 32.8 g/dL (ref 31.5–35.7)
MCV: 89 fL (ref 79–97)
MONOCYTES: 7 %
MONOS ABS: 0.7 10*3/uL (ref 0.1–0.9)
NEUTROS PCT: 57 %
Neutrophils Absolute: 5.2 10*3/uL (ref 1.4–7.0)
PLATELETS: 313 10*3/uL (ref 150–379)
RBC: 4.55 x10E6/uL (ref 3.77–5.28)
RDW: 13.7 % (ref 12.3–15.4)
WBC: 9.4 10*3/uL (ref 3.4–10.8)

## 2015-12-21 LAB — AMYLASE: Amylase: 113 U/L (ref 31–124)

## 2015-12-21 LAB — HEPATITIS C ANTIBODY: Hep C Virus Ab: <0.1 s/co ratio (ref 0.0–0.9)

## 2015-12-21 LAB — RPR: RPR: NONREACTIVE

## 2015-12-21 LAB — HIV ANTIBODY (ROUTINE TESTING W REFLEX): HIV Screen 4th Generation wRfx: NONREACTIVE

## 2015-12-21 LAB — LIPASE: LIPASE: 28 U/L (ref 0–59)

## 2016-01-05 ENCOUNTER — Encounter: Payer: Self-pay | Admitting: Family Medicine

## 2016-01-05 ENCOUNTER — Ambulatory Visit (INDEPENDENT_AMBULATORY_CARE_PROVIDER_SITE_OTHER): Payer: Medicaid Other | Admitting: Family Medicine

## 2016-01-05 ENCOUNTER — Encounter (INDEPENDENT_AMBULATORY_CARE_PROVIDER_SITE_OTHER): Payer: Self-pay

## 2016-01-05 VITALS — BP 99/63 | HR 80 | Temp 98.6°F | Ht 67.0 in | Wt 119.0 lb

## 2016-01-05 DIAGNOSIS — R1031 Right lower quadrant pain: Secondary | ICD-10-CM

## 2016-01-05 NOTE — Patient Instructions (Signed)
Great to see you!  PLease let us know if we can help or if you have any worsening pain. I am glad you are getting better.

## 2016-01-05 NOTE — Progress Notes (Signed)
   HPI  Patient presents today here for ER follow-up after ruptured ovarian cyst.  Patient had right lower quadrant abdominal pain and went to the emergency room for evaluation. He was found to have hemoperitoneum and signs of ruptured ovarian cyst. This was at [facility] and is reviewed in the chart.  She states that since that time she's had slowly improving pain, she has used a small amount of hydrocodone and has some left.  She did miss work yesterday and needs a work note to be excused.  She's tolerating food and fluids normally and feels well, more well and she expected she would.  PMH: Smoking status noted ROS: Per HPI  Objective: BP 99/63 mmHg  Pulse 80  Temp(Src) 98.6 F (37 C) (Oral)  Ht 5\' 7"  (1.702 m)  Wt 119 lb (53.978 kg)  BMI 18.63 kg/m2  LMP 11/28/2015 (Exact Date) Gen: NAD, alert, cooperative with exam HEENT: NCAT CV: RRR, good S1/S2, no murmur Resp: CTABL, no wheezes, non-labored Abd: Soft, mild tenderness to palpation in the right lower quadrant, positive bowel sounds Ext: No edema, warm Neuro: Alert and oriented, No gross deficits  Assessment and plan:  # Abdominal pain, ruptured ovarian cyst Pain improving Note written for work She has a small amount of Norco left if she needs it for pain Supportive care and reasons to return reviewed.      Murtis SinkSam Peyson Postema, MD [facility] 01/05/2016, 3:39 PM

## 2016-01-26 ENCOUNTER — Ambulatory Visit: Payer: Medicaid Other | Admitting: Family Medicine

## 2016-01-27 ENCOUNTER — Ambulatory Visit (INDEPENDENT_AMBULATORY_CARE_PROVIDER_SITE_OTHER): Payer: Medicaid Other

## 2016-01-27 ENCOUNTER — Encounter: Payer: Self-pay | Admitting: Family Medicine

## 2016-01-27 ENCOUNTER — Ambulatory Visit (INDEPENDENT_AMBULATORY_CARE_PROVIDER_SITE_OTHER): Payer: Medicaid Other | Admitting: Family Medicine

## 2016-01-27 VITALS — BP 105/67 | HR 78 | Temp 98.2°F | Ht 67.0 in | Wt 119.2 lb

## 2016-01-27 DIAGNOSIS — R112 Nausea with vomiting, unspecified: Secondary | ICD-10-CM | POA: Diagnosis not present

## 2016-01-27 DIAGNOSIS — S20211A Contusion of right front wall of thorax, initial encounter: Secondary | ICD-10-CM

## 2016-01-27 DIAGNOSIS — J201 Acute bronchitis due to Hemophilus influenzae: Secondary | ICD-10-CM | POA: Diagnosis not present

## 2016-01-27 MED ORDER — AMOXICILLIN-POT CLAVULANATE 875-125 MG PO TABS: 1.0000 | ORAL_TABLET | Freq: Two times a day (BID) | ORAL | Status: DC

## 2016-01-27 MED ORDER — DICYCLOMINE HCL 10 MG/5ML PO SOLN: 10.0000 mg | Freq: Every day | ORAL | Status: DC

## 2016-01-27 NOTE — Progress Notes (Signed)
Subjective:  Patient ID: Jennifer Key, female    DOB: 1996/07/11  Age: 20 y.o. MRN: [id number]  CC: Hospitalization Follow-up   HPI Keiri Solano presents for  Spent 5 days at [facility] last week, DCed on 01/17/2016. Nausea every morning for several weeks. Given IVs and  several meds and then started on Bentyl. That has helped the most. Minimal sx since DC. Now eating normally, but has  A cough. Also feels she broke a rib at RUQ with the cough - a painseparate from the nausea. Pain is sharp at anterior axillary line, the nausea is at epigastrum.  History Ayanah has no past medical history on file.   She has past surgical history that includes Elbow surgery (Left).   Her family history includes Heart disease in her maternal grandfather and maternal grandmother; Irritable bowel syndrome in her maternal grandmother; Prostate cancer in her maternal grandfather.She reports that she has never smoked. She has never used smokeless tobacco. She reports that she does not drink alcohol or use illicit drugs.    ROS Review of Systems  Constitutional: Negative for fever, activity change and appetite change.  HENT: Positive for rhinorrhea. Negative for congestion and sore throat.   Eyes: Negative for visual disturbance.  Respiratory: Positive for cough. Negative for shortness of breath.   Cardiovascular: Negative for chest pain and palpitations.  Gastrointestinal: Positive for nausea and abdominal pain (RUQ). Negative for diarrhea and constipation.       See HPI   Genitourinary: Negative for dysuria.  Musculoskeletal: Negative for myalgias and arthralgias.    Objective:  BP 105/67 mmHg  Pulse 78  Temp(Src) 98.2 F (36.8 C) (Oral)  Ht  (1.702 m)  Wt 119 lb 3.2 oz (54.069 kg)  BMI 18.67 kg/m2  SpO2 99%  LMP 01/01/2016  BP Readings from Last 3 Encounters:  01/27/16 105/67  01/05/16 99/63  12/20/15 110/65    Wt Readings from Last 3 Encounters:  01/27/16 119 lb 3.2 oz (54.069  kg)  01/05/16 119 lb (53.978 kg)  12/20/15 121 lb 6.4 oz (55.067 kg)     Physical Exam  Constitutional: She is oriented to person, place, and time. She appears well-developed and well-nourished.  HENT:  Head: Normocephalic and atraumatic.  Cardiovascular: Normal rate and regular rhythm.   No murmur heard. Pulmonary/Chest: Effort normal and breath sounds normal.  Abdominal: Soft. Bowel sounds are normal. She exhibits no mass. There is tenderness (RUQ at ant axillary line at rib10). There is no rebound and no guarding.  Neurological: She is alert and oriented to person, place, and time.  Skin: Skin is warm and dry.  Psychiatric: She has a normal mood and affect. Her behavior is normal.     Lab Results  Component Value Date   WBC 9.4 12/20/2015   HGB 13.6 04/27/2015   HCT 40.6 12/20/2015   PLT 313 12/20/2015   GLUCOSE 72 12/20/2015   ALT 9 12/20/2015   AST 13 12/20/2015   NA 142 12/20/2015   K 4.5 12/20/2015   CL 100 12/20/2015   CREATININE 0.72 12/20/2015   BUN 13 12/20/2015   CO2 26 12/20/2015    US Transvaginal Non-ob  10/28/2015  CLINICAL DATA:  Dysmenorrhea, menorrhagia, onset of irregular menstrual cycle approximately 4 months ago. EXAM: TRANSABDOMINAL AND TRANSVAGINAL ULTRASOUND OF PELVIS TECHNIQUE: Both transabdominal and transvaginal ultrasound examinations of the pelvis were performed. Transabdominal technique was performed for global imaging of the pelvis including uterus, ovaries, adnexal regions, and pelvic cul-de-sac. It  was necessary to proceed with endovaginal exam following the transabdominal exam to visualize the endometrium and ovaries COMPARISON:  None in PACs FINDINGS: Uterus Measurements: 8.9 x 3.6 x 4.8 cm. No fibroids or other mass visualized. The uterus appears mildly hypervascular. Endometrium Thickness: 9.5 mm.  No focal abnormality visualized. Right ovary Measurements: 3.7 x 1.3 x 2.4 cm. Normal appearance/no adnexal mass. Left ovary Measurements: 3.0 x  1.7 x 2.4 cm. Normal appearance/no adnexal mass. Other findings There is no free pelvic fluid. IMPRESSION: 1. Normal appearance of the uterus and endometrium. The myometrium does appear mildly hypervascular. There are no discrete fibroids. If bleeding remains unresponsive to hormonal or medical therapy, sonohysterogram should be considered for focal lesion work-up. (Ref: Radiological Reasoning: Algorithmic Workup of Abnormal Vaginal Bleeding with Endovaginal Sonography and Sonohysterography. AJR 2008; 161:[id number]:S68-73) 2. Normal appearance of the ovaries.  No free pelvic fluid. Electronically Signed   By: David  SwazilandJordan M.D.   On: 10/28/2015 11:29   [place] Pelvis Complete  10/28/2015  CLINICAL DATA:  Dysmenorrhea, menorrhagia, onset of irregular menstrual cycle approximately 4 months ago. EXAM: TRANSABDOMINAL AND TRANSVAGINAL ULTRASOUND OF PELVIS TECHNIQUE: Both transabdominal and transvaginal ultrasound examinations of the pelvis were performed. Transabdominal technique was performed for global imaging of the pelvis including uterus, ovaries, adnexal regions, and pelvic cul-de-sac. It was necessary to proceed with endovaginal exam following the transabdominal exam to visualize the endometrium and ovaries COMPARISON:  None in PACs FINDINGS: Uterus Measurements: 8.9 x 3.6 x 4.8 cm. No fibroids or other mass visualized. The uterus appears mildly hypervascular. Endometrium Thickness: 9.5 mm.  No focal abnormality visualized. Right ovary Measurements: 3.7 x 1.3 x 2.4 cm. Normal appearance/no adnexal mass. Left ovary Measurements: 3.0 x 1.7 x 2.4 cm. Normal appearance/no adnexal mass. Other findings There is no free pelvic fluid. IMPRESSION: 1. Normal appearance of the uterus and endometrium. The myometrium does appear mildly hypervascular. There are no discrete fibroids. If bleeding remains unresponsive to hormonal or medical therapy, sonohysterogram should be considered for focal lesion work-up. (Ref: Radiological Reasoning:  Algorithmic Workup of Abnormal Vaginal Bleeding with Endovaginal Sonography and Sonohysterography. AJR 2008; 540:[id number]:S68-73) 2. Normal appearance of the ovaries.  No free pelvic fluid. Electronically Signed   By: David  SwazilandJordan M.D.   On: 10/28/2015 11:29    Assessment & Plan:   Gordy Councilmanlexandra was seen today for hospitalization follow-up.  Diagnoses and all orders for this visit:  Rib contusion, right, initial encounter -     DG Ribs Unilateral W/Chest Right; Future  Nausea and vomiting, intractability of vomiting not specified, unspecified vomiting type  Acute bronchitis due to Haemophilus influenzae  Other orders -     amoxicillin-clavulanate (AUGMENTIN) 875-125 MG tablet; Take 1 tablet by mouth 2 (two) times daily. Take all of this medication -     dicyclomine (BENTYL) 10 MG/5ML syrup; Take 5 mLs (10 mg total) by mouth daily.     I have discontinued Ms. Isaacks's HYDROcodone-acetaminophen. I have also changed her dicyclomine. Additionally, I am having her start on amoxicillin-clavulanate. Lastly, I am having her maintain her ondansetron, polyethylene glycol powder, norethindrone-ethinyl estradiol, metoCLOPramide, pantoprazole, and promethazine.  Meds ordered this encounter  Medications  . DISCONTD: dicyclomine (BENTYL) 10 MG/5ML syrup    Sig: Take 10 mg by mouth daily.   . metoCLOPramide (REGLAN) 5 MG/5ML solution    Sig: Take 10 mg by mouth daily.   . pantoprazole (PROTONIX) 40 MG tablet    Sig: Take 40 mg by mouth daily.   .Marland Kitchen  promethazine (PHENERGAN) 25 MG suppository    Sig: Place 25 mg rectally every 6 (six) hours as needed.     Refill:  0  . amoxicillin-clavulanate (AUGMENTIN) 875-125 MG tablet    Sig: Take 1 tablet by mouth 2 (two) times daily. Take all of this medication    Dispense:  20 tablet    Refill:  0  . dicyclomine (BENTYL) 10 MG/5ML syrup    Sig: Take 5 mLs (10 mg total) by mouth daily.    Dispense:  240 mL    Refill:  5     Follow-up: Return in about 3 months  (around 04/28/2016).  Mechele Claude, M.D.

## 2016-04-24 ENCOUNTER — Ambulatory Visit (INDEPENDENT_AMBULATORY_CARE_PROVIDER_SITE_OTHER): Payer: Medicaid Other | Admitting: Family Medicine

## 2016-04-24 ENCOUNTER — Ambulatory Visit (INDEPENDENT_AMBULATORY_CARE_PROVIDER_SITE_OTHER): Payer: Medicaid Other

## 2016-04-24 ENCOUNTER — Encounter: Payer: Self-pay | Admitting: Family Medicine

## 2016-04-24 VITALS — BP 102/64 | HR 68 | Temp 98.1°F | Ht 67.0 in | Wt 125.0 lb

## 2016-04-24 DIAGNOSIS — R1030 Lower abdominal pain, unspecified: Secondary | ICD-10-CM | POA: Diagnosis not present

## 2016-04-24 DIAGNOSIS — Z3200 Encounter for pregnancy test, result unknown: Secondary | ICD-10-CM | POA: Diagnosis not present

## 2016-04-24 MED ORDER — ONDANSETRON 8 MG PO TBDP: 8.0000 mg | ORAL_TABLET | Freq: Four times a day (QID) | ORAL | 1 refills | Status: DC | PRN

## 2016-04-24 MED ORDER — DICYCLOMINE HCL 10 MG/5ML PO SOLN: 10.0000 mg | Freq: Three times a day (TID) | ORAL | 5 refills | Status: DC

## 2016-04-24 NOTE — Progress Notes (Signed)
Subjective:  Patient ID: Jennifer Key, female    DOB: [birth year]  Age: 20 y.o. MRN: [id number]  CC: GI upset (nausea with vomiting started this AM)   HPI Jennifer Key presents for Acute onset of abdominal pain this morning. She awoke at 6:30 this morning feeling a heaviness in her lower abdomen. She sat up and immediately vomited. Shortly after that she had an episode of diarrhea. This was followed by a second episode of vomiting. All of this happened fairly quickly together. Since then the pain continues but she has had no further diarrhea or vomiting. She currently is not nauseous. Pain is moderately severe heaviness. She is on her menstrual cycle right now. It is the normal time. Her flow is normal. She is sexually active her partner is using condoms without spermicide.   History Talani has no past medical history on file.   She has a past surgical history that includes Elbow surgery (Left).   Her family history includes Heart disease in her maternal grandfather and maternal grandmother; Irritable bowel syndrome in her maternal grandmother; Prostate cancer in her maternal grandfather.She reports that she has never smoked. She has never used smokeless tobacco. She reports that she does not drink alcohol or use drugs.    ROS Review of Systems  Constitutional: Negative for fever.  HENT: Negative for congestion, rhinorrhea and sore throat.   Respiratory: Negative for cough and shortness of breath.   Cardiovascular: Negative for chest pain and palpitations.  Gastrointestinal: Positive for abdominal pain.  Musculoskeletal: Negative for arthralgias and myalgias.    Objective:  BP 102/64 (BP Location: Left Arm, Patient Position: Sitting, Cuff Size: Normal)   Pulse 68   Temp 98.1 F (36.7 C) (Oral)   Ht 5' 7"  (1.702 m)   Wt 125 lb (56.7 kg)   LMP 04/19/2016   SpO2 100%   BMI 19.58 kg/m   BP Readings from Last 3 Encounters:  04/24/16 102/64  01/27/16 105/67  01/05/16  99/63    Wt Readings from Last 3 Encounters:  04/24/16 125 lb (56.7 kg)  01/27/16 119 lb 3.2 oz (54.1 kg)  01/05/16 119 lb (54 kg)     Physical Exam  Constitutional: She is oriented to person, place, and time. She appears well-developed and well-nourished.  HENT:  Head: Normocephalic and atraumatic.  Cardiovascular: Normal rate and regular rhythm.   No murmur heard. Pulmonary/Chest: Effort normal and breath sounds normal.  Abdominal: Soft. Bowel sounds are normal. She exhibits no mass. There is tenderness. There is no rebound and no guarding.  Neurological: She is alert and oriented to person, place, and time.  Skin: Skin is warm and dry.  Psychiatric: She has a normal mood and affect. Her behavior is normal.     Lab Results  Component Value Date   WBC 9.4 12/20/2015   HGB 13.6 04/27/2015   HCT 40.6 12/20/2015   PLT 313 12/20/2015   GLUCOSE 72 12/20/2015   ALT 9 12/20/2015   AST 13 12/20/2015   NA 142 12/20/2015   K 4.5 12/20/2015   CL 100 12/20/2015   CREATININE 0.72 12/20/2015   BUN 13 12/20/2015   CO2 26 12/20/2015      Assessment & Plan:   Lanie was seen today for gi upset.  Diagnoses and all orders for this visit:  Lower abdominal pain -     POCT urine pregnancy -     Amylase -     Lipase -     DG  Abd 2 Views; Future -     CBC with Differential/Platelet -     CMP14+EGFR -     Urinalysis, Complete  Possible pregnancy -     POCT urine pregnancy  Other orders -     ondansetron (ZOFRAN-ODT) 8 MG disintegrating tablet; Take 1 tablet (8 mg total) by mouth every 6 (six) hours as needed for nausea or vomiting. -     dicyclomine (BENTYL) 10 MG/5ML syrup; Take 5 mLs (10 mg total) by mouth 4 (four) times daily -  before meals and at bedtime.    I have discontinued Ms. Goodlow's norethindrone-ethinyl estradiol, promethazine, and amoxicillin-clavulanate. I have also changed her dicyclomine. Additionally, I am having her maintain her polyethylene glycol  powder, metoCLOPramide, pantoprazole, and ondansetron.  Meds ordered this encounter  Medications  . ondansetron (ZOFRAN-ODT) 8 MG disintegrating tablet    Sig: Take 1 tablet (8 mg total) by mouth every 6 (six) hours as needed for nausea or vomiting.    Dispense:  30 tablet    Refill:  1  . dicyclomine (BENTYL) 10 MG/5ML syrup    Sig: Take 5 mLs (10 mg total) by mouth 4 (four) times daily -  before meals and at bedtime.    Dispense:  600 mL    Refill:  5     Follow-up: No Follow-up on file.  Claretta Fraise, M.D.

## 2016-04-25 LAB — CMP14+EGFR
ALBUMIN: 4.5 g/dL (ref 3.5–5.5)
ALK PHOS: 78 IU/L (ref 39–117)
ALT: 11 IU/L (ref 0–32)
AST: 18 IU/L (ref 0–40)
Albumin/Globulin Ratio: 1.7 (ref 1.2–2.2)
BILIRUBIN TOTAL: 0.4 mg/dL (ref 0.0–1.2)
BUN / CREAT RATIO: 12 (ref 9–23)
BUN: 8 mg/dL (ref 6–20)
CHLORIDE: 101 mmol/L (ref 96–106)
CO2: 26 mmol/L (ref 18–29)
CREATININE: 0.69 mg/dL (ref 0.57–1.00)
Calcium: 9.6 mg/dL (ref 8.7–10.2)
GFR calc Af Amer: 145 mL/min/{1.73_m2} (ref >59)
GFR calc non Af Amer: 126 mL/min/{1.73_m2} (ref >59)
GLUCOSE: 87 mg/dL (ref 65–99)
Globulin, Total: 2.6 g/dL (ref 1.5–4.5)
Potassium: 4.8 mmol/L (ref 3.5–5.2)
Sodium: 140 mmol/L (ref 134–144)
Total Protein: 7.1 g/dL (ref 6.0–8.5)

## 2016-04-25 LAB — CBC WITH DIFFERENTIAL/PLATELET
BASOS ABS: 0 10*3/uL (ref 0.0–0.2)
Basos: 0 %
EOS (ABSOLUTE): 0 10*3/uL (ref 0.0–0.4)
Eos: 0 %
Hematocrit: 40.4 % (ref 34.0–46.6)
Hemoglobin: 13.4 g/dL (ref 11.1–15.9)
Immature Grans (Abs): 0 10*3/uL (ref 0.0–0.1)
Immature Granulocytes: 0 %
LYMPHS ABS: 1.3 10*3/uL (ref 0.7–3.1)
Lymphs: 12 %
MCH: 29.3 pg (ref 26.6–33.0)
MCHC: 33.2 g/dL (ref 31.5–35.7)
MCV: 88 fL (ref 79–97)
MONOCYTES: 4 %
MONOS ABS: 0.4 10*3/uL (ref 0.1–0.9)
NEUTROS ABS: 9.2 10*3/uL — AB (ref 1.4–7.0)
Neutrophils: 84 %
PLATELETS: 295 10*3/uL (ref 150–379)
RBC: 4.58 x10E6/uL (ref 3.77–5.28)
RDW: 13.1 % (ref 12.3–15.4)
WBC: 10.9 10*3/uL — AB (ref 3.4–10.8)

## 2016-04-25 LAB — AMYLASE: AMYLASE: 116 U/L (ref 31–124)

## 2016-04-25 LAB — LIPASE: LIPASE: 26 U/L (ref 0–59)

## 2016-05-11 ENCOUNTER — Encounter: Payer: Self-pay | Admitting: Family

## 2016-05-11 ENCOUNTER — Ambulatory Visit (INDEPENDENT_AMBULATORY_CARE_PROVIDER_SITE_OTHER): Payer: Medicaid Other | Admitting: Family

## 2016-05-11 VITALS — BP 104/65 | HR 86 | Temp 98.9°F | Ht 67.0 in | Wt 127.6 lb

## 2016-05-11 DIAGNOSIS — R1031 Right lower quadrant pain: Secondary | ICD-10-CM

## 2016-05-11 DIAGNOSIS — L301 Dyshidrosis [pompholyx]: Secondary | ICD-10-CM | POA: Diagnosis not present

## 2016-05-11 DIAGNOSIS — R1032 Left lower quadrant pain: Secondary | ICD-10-CM | POA: Diagnosis not present

## 2016-05-11 DIAGNOSIS — G8929 Other chronic pain: Secondary | ICD-10-CM | POA: Diagnosis not present

## 2016-05-11 NOTE — Progress Notes (Signed)
   Subjective:    Patient ID: Jennifer Key, female    DOB: 09/10/1996, 20 y.o.   MRN: [id number]  Pt presents with chronic abdominal pain. PT has been to GI and had a negative upper endoscopy and has had a negative abdomen ultrasound, and transvaginal ultrasound. Pt is also complaining of a rash on bilateral hands that she noticed a few weeks ago that has become worse. Pt admits to itching and has not tried anything.    Abdominal Pain  This is a chronic problem. The current episode started 1 to 4 weeks ago. The onset quality is gradual. The problem occurs intermittently. The problem has been unchanged. The pain is located in the RLQ and LLQ. The pain is at a severity of 6/10. The pain is moderate. The quality of the pain is cramping. Associated symptoms include diarrhea, flatus, frequency and nausea. Pertinent negatives include no belching, constipation, headaches, hematuria or vomiting. Nothing aggravates the pain. The pain is relieved by bowel movements. Treatments tried: bentyl. The treatment provided mild relief. Prior diagnostic workup includes GI consult and upper endoscopy.      Review of Systems  Constitutional: Negative.   HENT: Negative.   Eyes: Negative.   Respiratory: Negative.  Negative for shortness of breath.   Cardiovascular: Negative.  Negative for palpitations.  Gastrointestinal: Positive for abdominal pain, diarrhea, flatus and nausea. Negative for constipation and vomiting.  Endocrine: Negative.   Genitourinary: Positive for frequency. Negative for hematuria.  Musculoskeletal: Negative.   Skin: Positive for rash (on hands).  Neurological: Negative.  Negative for headaches.  Hematological: Negative.   Psychiatric/Behavioral: Negative.   All other systems reviewed and are negative.      Objective:   Physical Exam  Constitutional: She is oriented to person, place, and time. She appears well-developed and well-nourished. No distress.  HENT:  Head: Normocephalic  and atraumatic.  Eyes: Pupils are equal, round, and reactive to light.  Neck: Normal range of motion. Neck supple. No thyromegaly present.  Cardiovascular: Normal rate, regular rhythm, normal heart sounds and intact distal pulses.   No murmur heard. Pulmonary/Chest: Effort normal and breath sounds normal. No respiratory distress. She has no wheezes.  Abdominal: Soft. Bowel sounds are normal. She exhibits no distension. There is no tenderness.  Musculoskeletal: Normal range of motion. She exhibits no edema or tenderness.  Neurological: She is alert and oriented to person, place, and time. She has normal reflexes. No cranial nerve deficit.  Skin: Skin is warm and dry. Rash noted.  Vesicle rash on bilateral palms and middle and ring finger  Psychiatric: She has a normal mood and affect. Her behavior is normal. Judgment and thought content normal.  Vitals reviewed.   BP 104/65   Pulse 86   Temp 98.9 F (37.2 C) (Oral)   Ht 5\' 7"  (1.702 m)   Wt 127 lb 9.6 oz (57.9 kg)   LMP 04/19/2016   BMI 19.98 kg/m        Assessment & Plan:  1. Dyshidrotic eczema -Moisturizer daily -Hydrocortisone cream as needed  2. Chronic bilateral lower abdominal pain -Bently as needed -Continue miralax  RTO prn   Jannifer Rodneyhristy Hawks, FNP

## 2016-05-11 NOTE — Patient Instructions (Signed)
Hand Dermatitis Hand dermatitis (dyshidrotic eczema) is a skin condition in which small, itchy, raised dots or fluid-filled blisters form over the palms of the hands. Outbreaks of hand dermatitis can last 3 to 4 weeks. CAUSES  The cause of hand dermatitis is unknown. However, it occurs most often in patients with a history of allergies such as:  Hay fever.  Allergic asthma.  Allergies to latex. Chemical exposure, injuries, and environmental irritants can make hand dermatitis worse. Washing your hands too frequently can remove natural oils, which can dry out the skin and contribute to outbreaks of hand dermatitis. SYMPTOMS  The most common symptom of hand dermatitis is intense itching. Cracks or grooves (fissures) on the fingers can also develop. Affected areas can be painful, especially areas where large blisters have formed. DIAGNOSIS Your caregiver can usually tell what the problem is by doing a physical exam. PREVENTION  Avoid excessive hand washing.  Avoid the use of harsh chemicals.  Wear protective gloves when handling products that can irritate your skin. TREATMENT  Steroid creams and ointments, such as over-the-counter 1% hydrocortisone cream, can reduce inflammation and improve moisture retention. These should be applied at least 2 to 4 times per day. Your caregiver may ask you to use a stronger prescription steroid cream to help speed the healing of blistered and cracked skin. In severe cases, oral steroid medicine may be needed. If you have an infection, antibiotics may be needed. Your caregiver may also prescribe antihistamines. These medicines help reduce itching. HOME CARE INSTRUCTIONS  Only take over-the-counter or prescription medicines as directed by your caregiver.  You may use wet or cold compresses. This can help:  Alleviate itching.  Increase the effectiveness of topical creams.  Minimize blisters. SEEK MEDICAL CARE IF:  The rash is not better after 1 week of  treatment.  Signs of infection develop, such as redness, tenderness, or yellowish-white fluid (pus).  The rash is spreading.   This information is not intended to replace advice given to you by your health care provider. Make sure you discuss any questions you have with your health care provider.   Document Released: 08/28/2005 Document Revised: 11/20/2011 Document Reviewed: 03/12/2015 Elsevier Interactive Patient Education 2016 [employer]  

## 2016-10-13 IMAGING — DX DG RIBS W/ CHEST 3+V*R*
2 series · 2 of 2 positions shown · non-contrast
Comparison: None.

CLINICAL DATA: Nausea and vomiting, right anterolateral rib pain

EXAM:
RIGHT RIBS AND CHEST - 3+ VIEW

[chest pa]
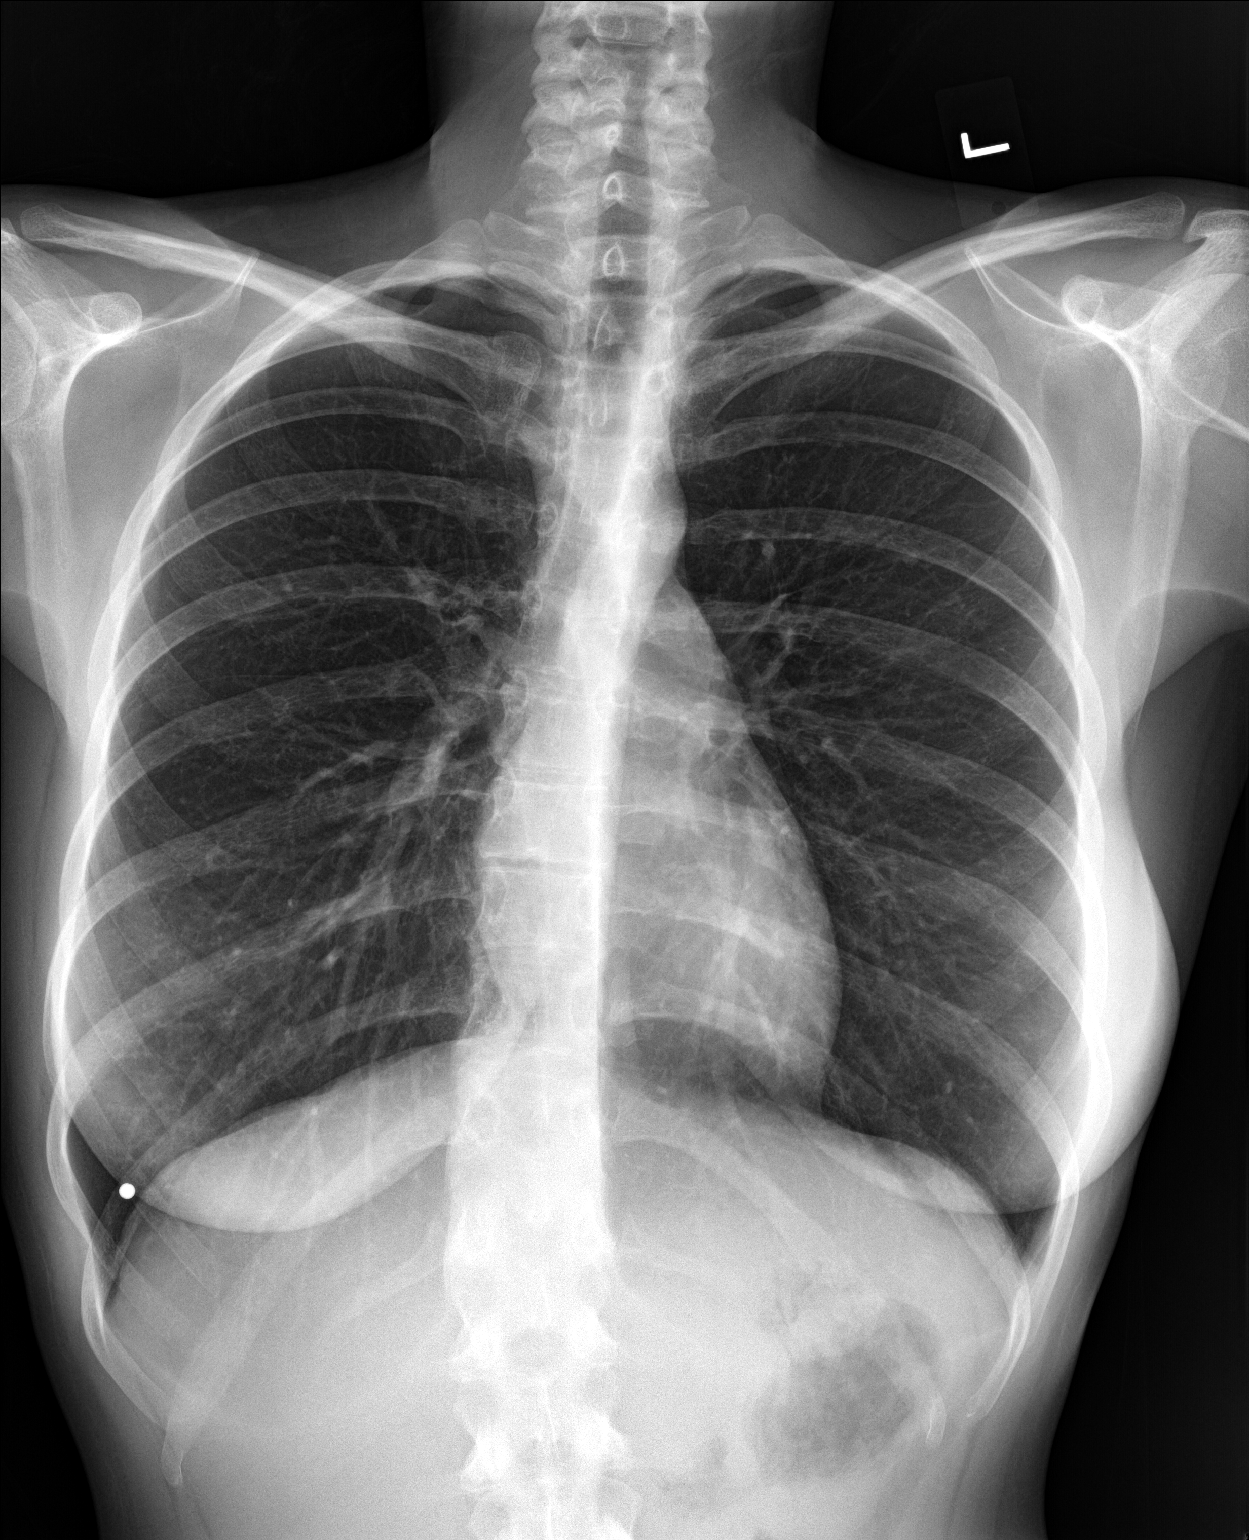

[rib obl]
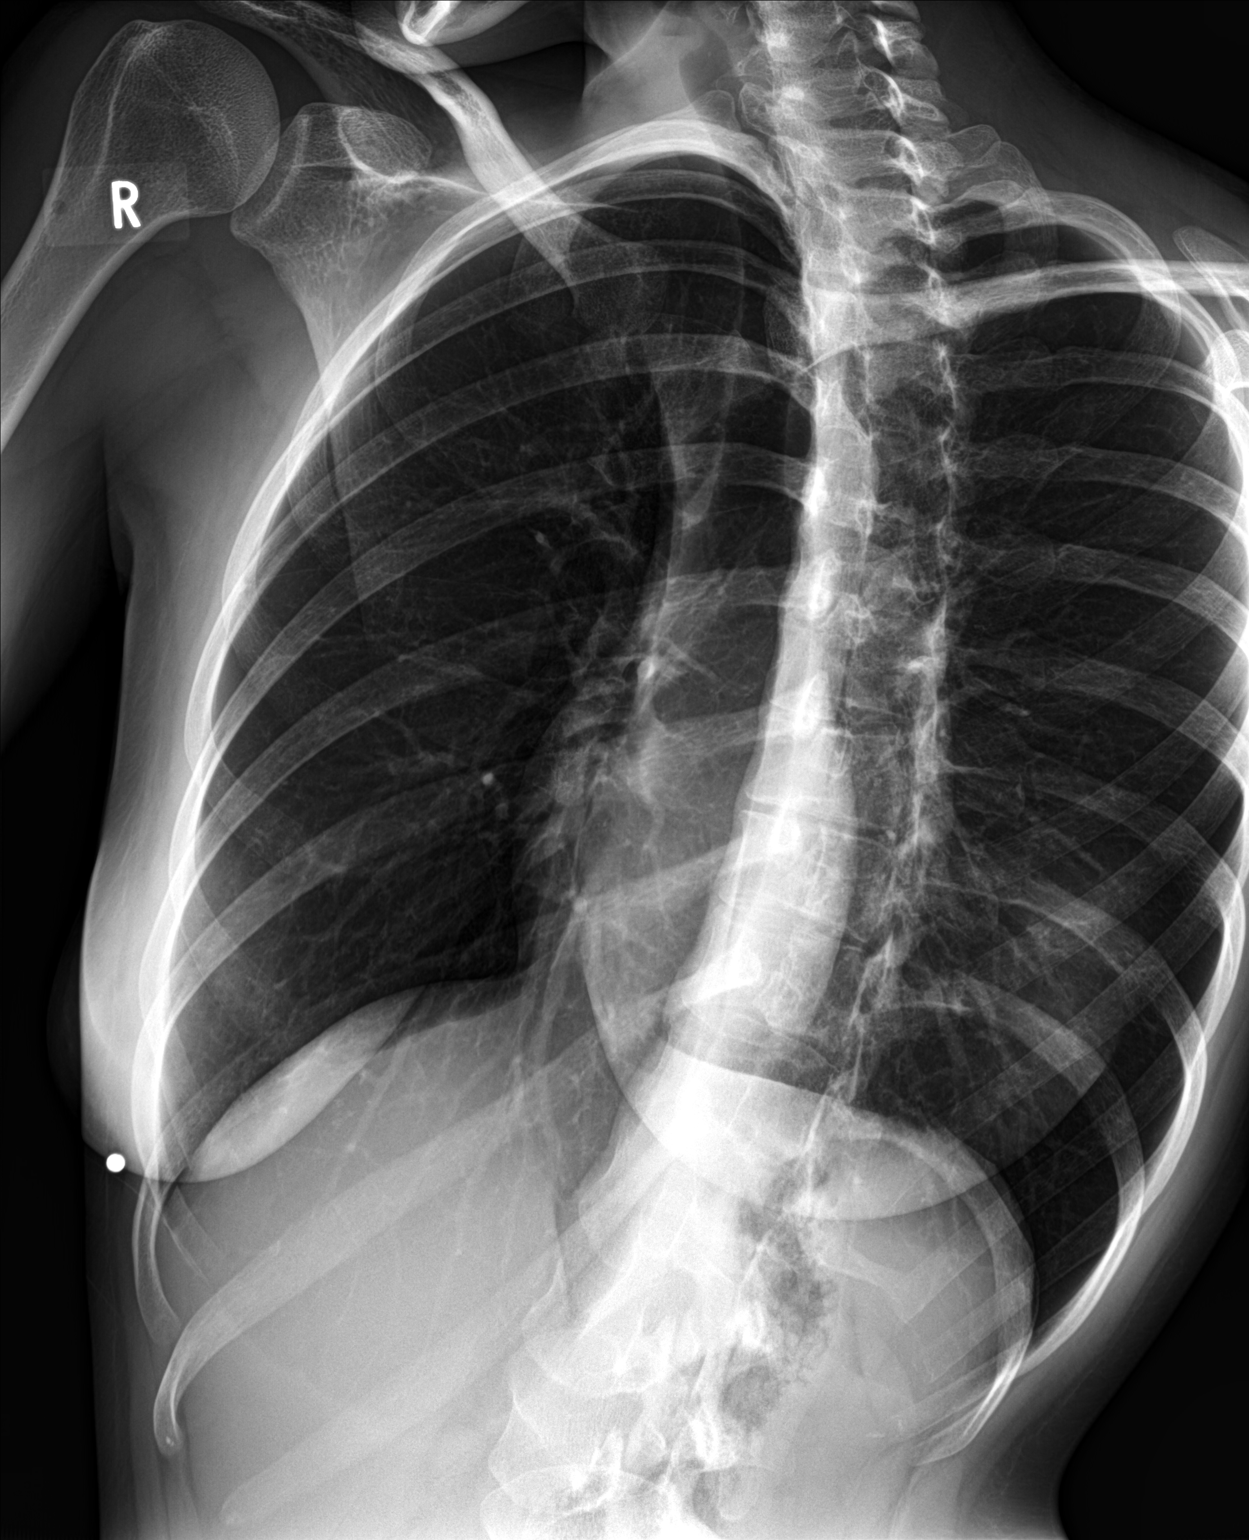

[2 of 2 positions shown; findings below may reference images not displayed]

FINDINGS: The lungs are clear. Mediastinal and hilar contours are
unremarkable. The heart is within normal limits in size. The area of
pain over the right lower anterior ribs was marked with a metallic
marker. No underlying rib abnormality is seen.
IMPRESSION: No active lung disease.  Negative right rib films.

## 2016-11-09 ENCOUNTER — Ambulatory Visit (INDEPENDENT_AMBULATORY_CARE_PROVIDER_SITE_OTHER): Payer: Self-pay | Admitting: Nurse Practitioner

## 2016-11-09 ENCOUNTER — Encounter: Payer: Self-pay | Admitting: Nurse Practitioner

## 2016-11-09 VITALS — BP 106/70 | HR 60 | Temp 98.5°F | Ht 67.0 in | Wt 135.0 lb

## 2016-11-09 DIAGNOSIS — K529 Noninfective gastroenteritis and colitis, unspecified: Secondary | ICD-10-CM

## 2016-11-09 MED ORDER — ONDANSETRON 8 MG PO TBDP: 8.0000 mg | ORAL_TABLET | Freq: Four times a day (QID) | ORAL | 0 refills | Status: DC | PRN

## 2016-11-09 NOTE — Progress Notes (Signed)
   Subjective:    Patient ID: Jennifer Key, female    DOB: 12/27/1995, 21 y.o.   MRN: [id number]  HPI: Nausea, vomiting, and diarrhea x 3 days. Has been in close contact with others of like symptoms. Denies fever. Denies blood in stool/emesis. Still has appendix and gallbladder.    Review of Systems  Constitutional: Positive for fatigue. Negative for fever.  HENT: Negative.   Respiratory: Negative.  Negative for shortness of breath.   Cardiovascular: Negative.  Negative for chest pain.  Gastrointestinal: Positive for abdominal pain, diarrhea, nausea and vomiting.  Genitourinary: Negative for difficulty urinating and flank pain.  Psychiatric/Behavioral: Negative.   All other systems reviewed and are negative.      Objective:   Physical Exam  Constitutional: She appears well-developed and well-nourished.  Cardiovascular: Normal rate and regular rhythm.   Pulmonary/Chest: Effort normal and breath sounds normal. No respiratory distress.  Abdominal: Soft. Bowel sounds are normal. There is tenderness (LUQ).  Skin: Skin is warm and dry.  Facial flushing  Psychiatric: She has a normal mood and affect. Her behavior is normal. Judgment and thought content normal.    BP 106/70   Pulse 60   Temp 98.5 F (36.9 C) (Oral)   Ht 5\' 7"  (1.702 m)   Wt 135 lb (61.2 kg)   BMI 21.14 kg/m   .      Assessment & Plan:   1. Gastroenteritis   First 24 Hours-Clear liquids  popsicles  Jello  gatorade  Sprite Second 24 hours-Add Full liquids ( Liquids you cant see through) Third 24 hours- Bland diet ( foods that are baked or broiled)  *avoiding fried foods and highly spiced foods* During these 3 days  Avoid milk, cheese, ice cream or any other dairy products  Avoid caffeine- REMEMBER Mt. Dew and Mello Yellow contain lots of caffeine You should eat and drink in  Frequent small volumes If no improvement in  symptoms or worsen in 2-3 days should RETRUN TO OFFICE or go to ER!    Meds  ordered this encounter  Medications  . ondansetron (ZOFRAN-ODT) 8 MG disintegrating tablet    Sig: Take 1 tablet (8 mg total) by mouth every 6 (six) hours as needed for nausea or vomiting.    Dispense:  10 tablet    Refill:  0    Order Specific Question:   Supervising Provider    Answer:   Johna SheriffVINCENT, CAROL L [[id number]]   Mary-Margaret Daphine DeutscherMartin, FNP

## 2016-11-09 NOTE — Patient Instructions (Signed)

## 2017-01-09 IMAGING — DX DG ABDOMEN 2V
2 series · 2 of 2 positions shown · non-contrast
Comparison: 12/20/2015.

CLINICAL DATA: Lower abdominal pain.  Loose stools.

EXAM:
ABDOMEN - 2 VIEW

[abdomen erect]
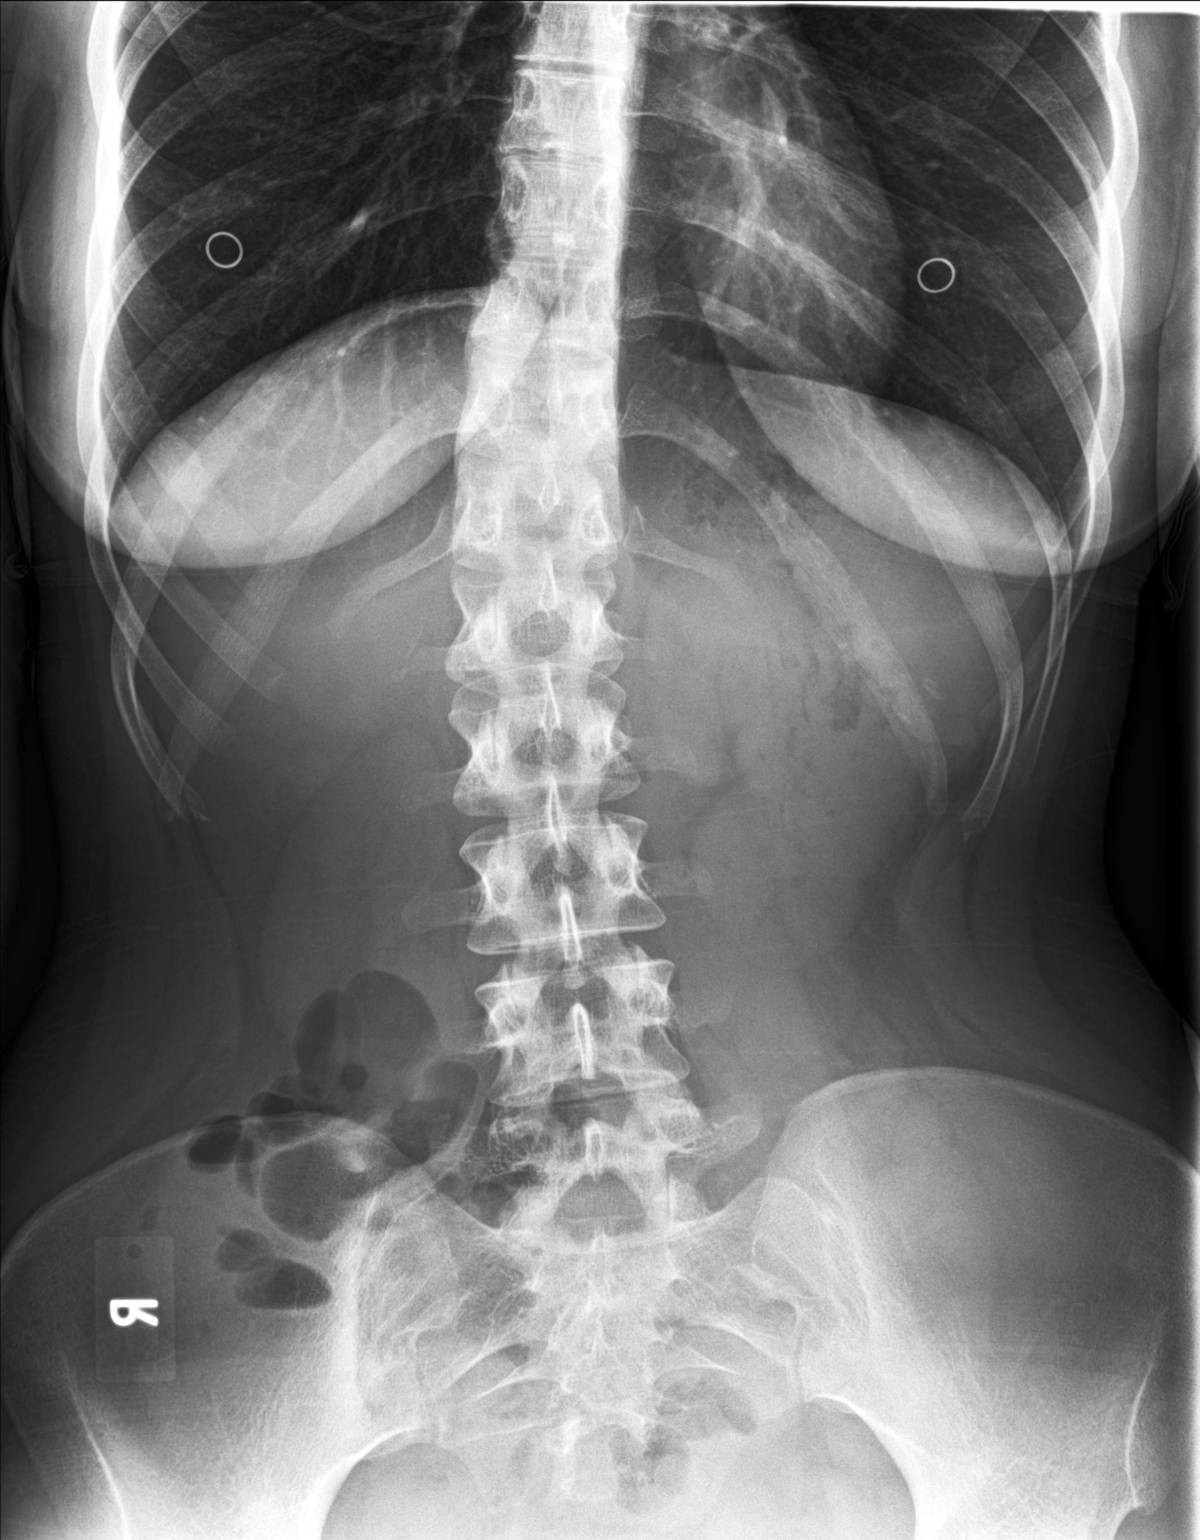

[abdomen supine]
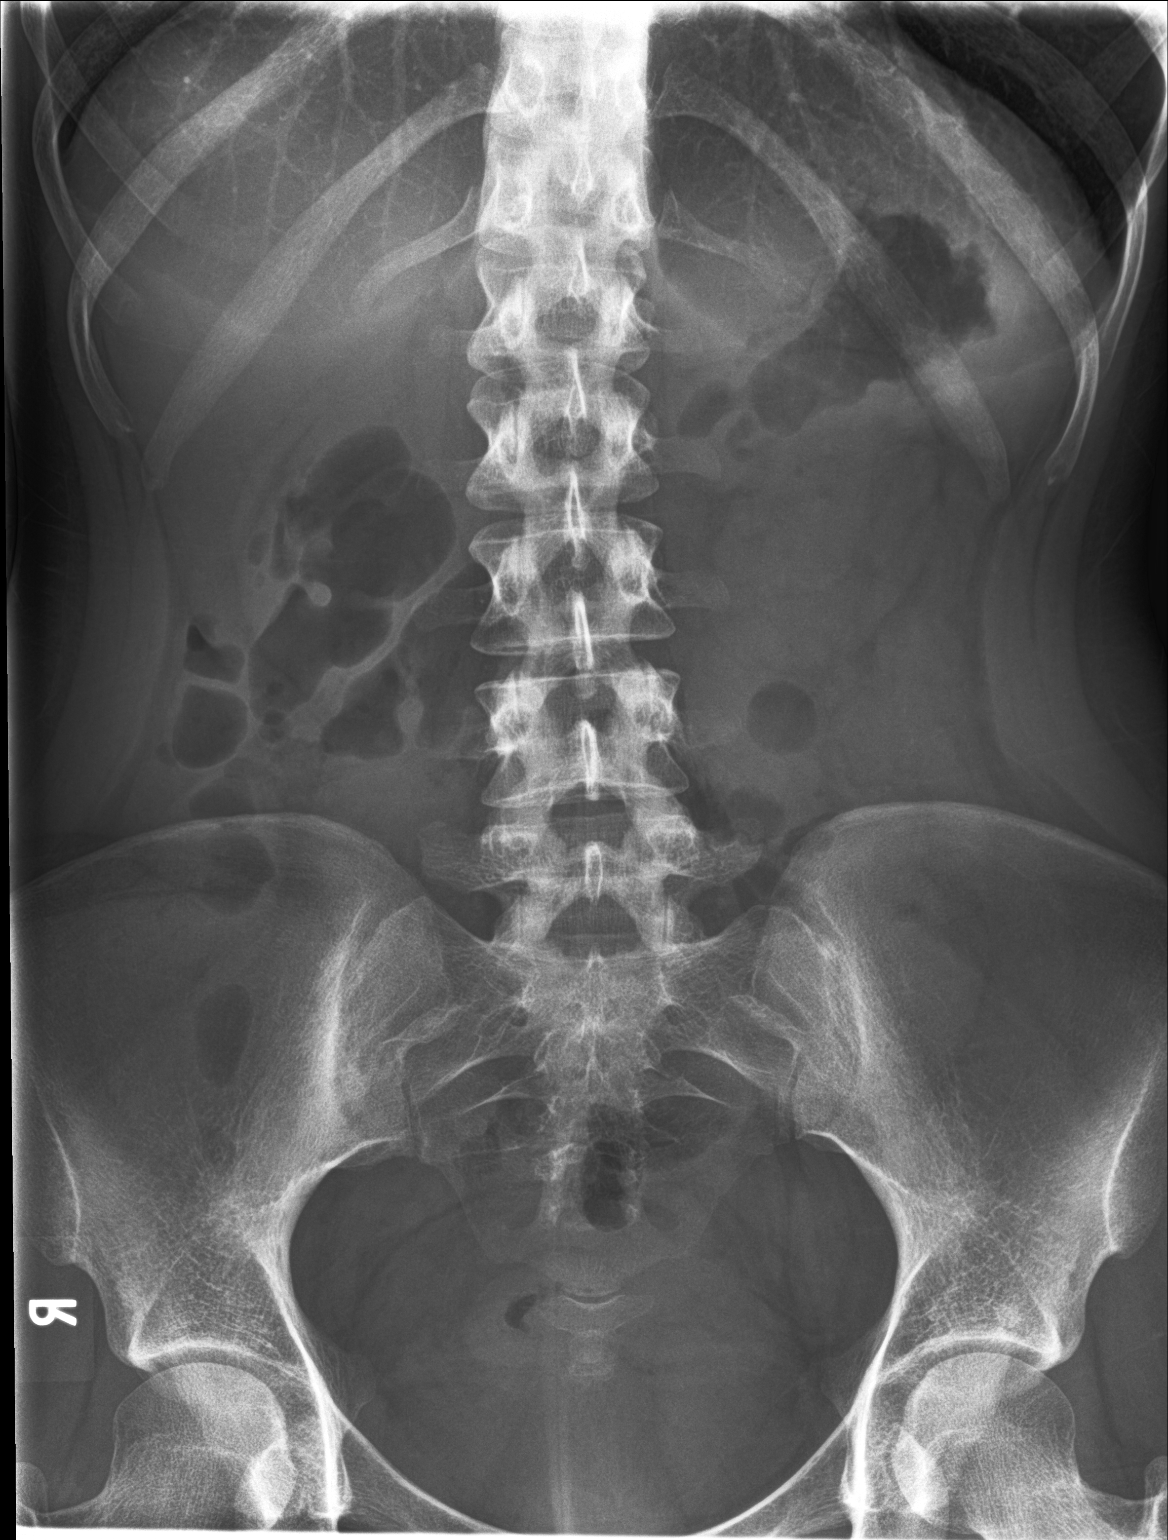

[2 of 2 positions shown; findings below may reference images not displayed]

FINDINGS: Normal bowel gas pattern without free peritoneal air. Mild
dextroconvex thoracolumbar scoliosis.
IMPRESSION: No acute abnormality.

## 2017-07-17 IMAGING — US US PELVIS COMPLETE
1 series · 13 of 25 positions shown · non-contrast
Comparison: None in PACs

CLINICAL DATA: Dysmenorrhea, menorrhagia, onset of irregular
menstrual cycle approximately 4 months ago.

EXAM:
TRANSABDOMINAL AND TRANSVAGINAL ULTRASOUND OF PELVIS
TECHNIQUE: Both transabdominal and transvaginal ultrasound examinations of the
pelvis were performed. Transabdominal technique was performed for
global imaging of the pelvis including uterus, ovaries, adnexal
regions, and pelvic cul-de-sac. It was necessary to proceed with
endovaginal exam following the transabdominal exam to visualize the
endometrium and ovaries

[Series 1: us pelvis complete · 0.15mm/px · 13 of 116 slices shown]
[im 1/116]
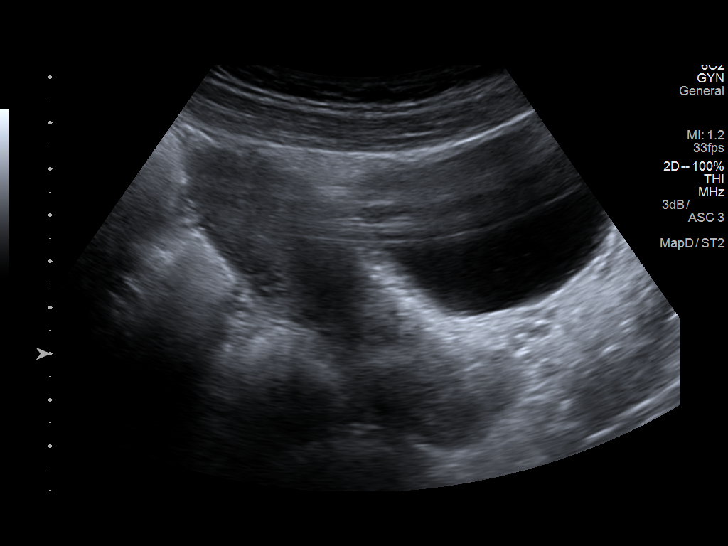
[im 10/116]
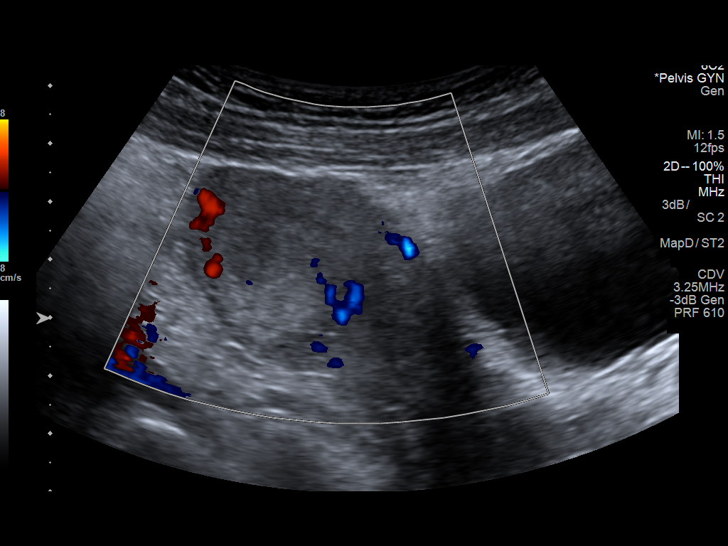
[im 20/116]
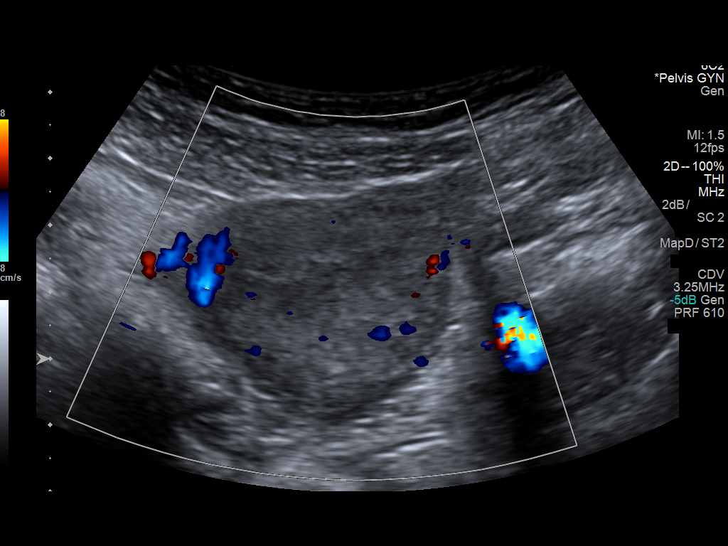
[im 29/116]
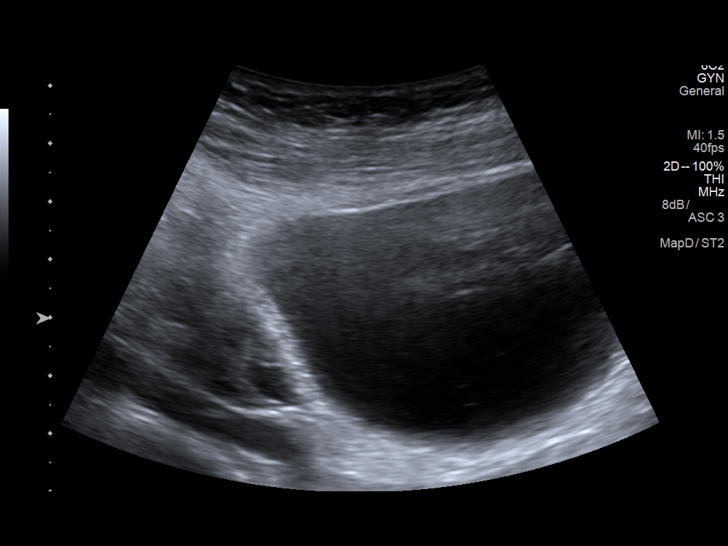
[im 39/116]
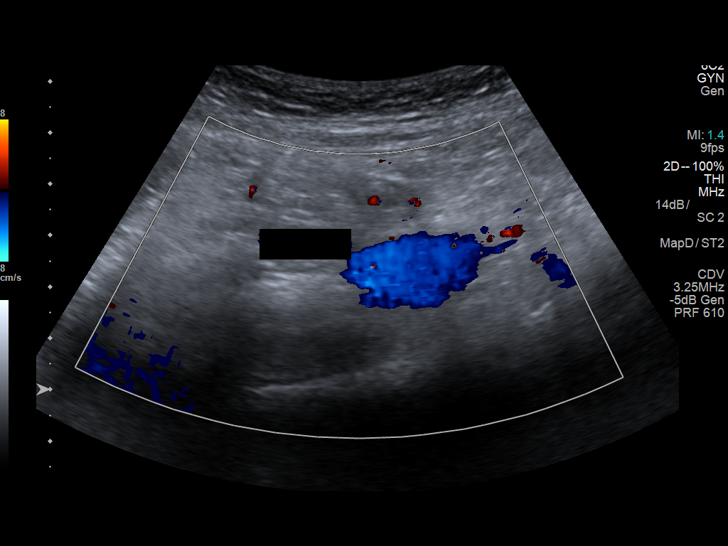
[im 48/116]
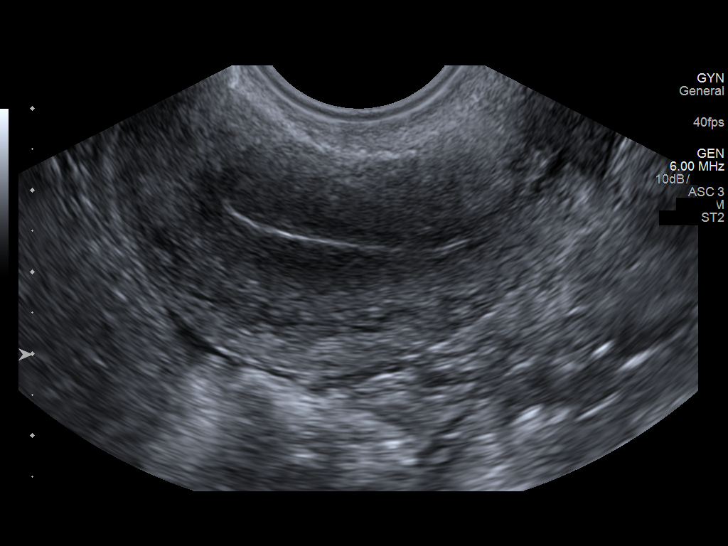
[im 58/116]
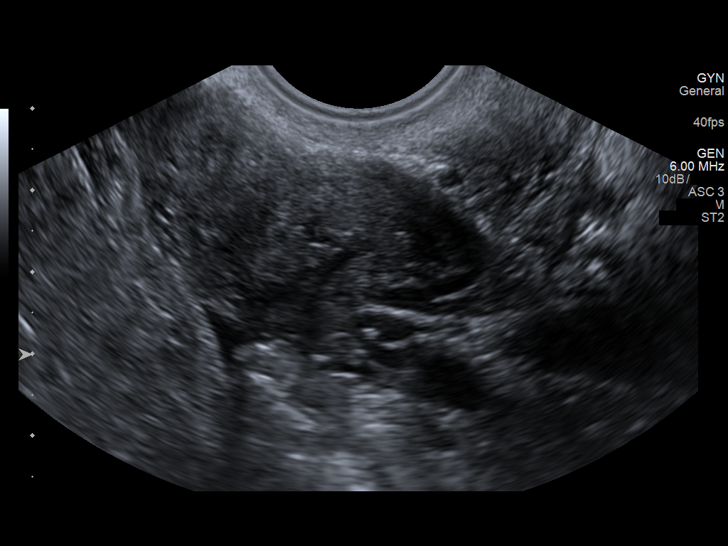
[im 68/116]
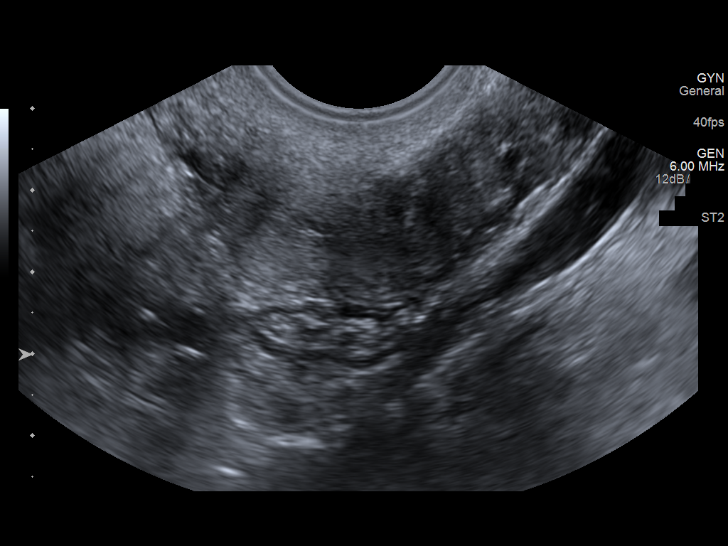
[im 77/116]
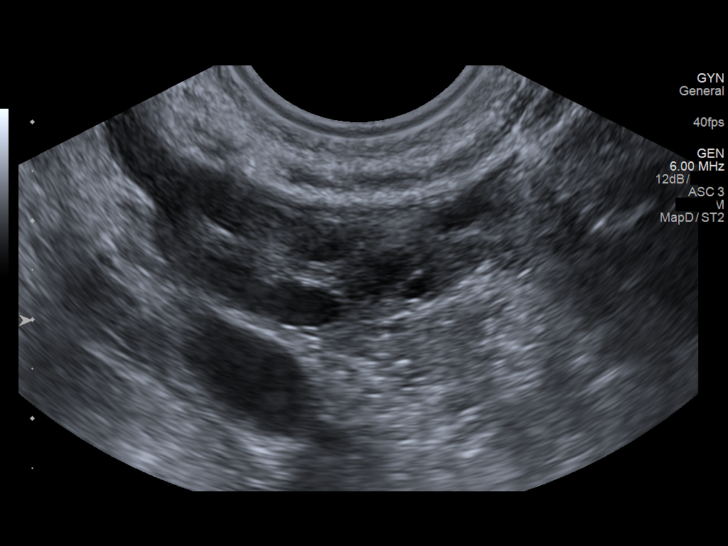
[im 87/116]
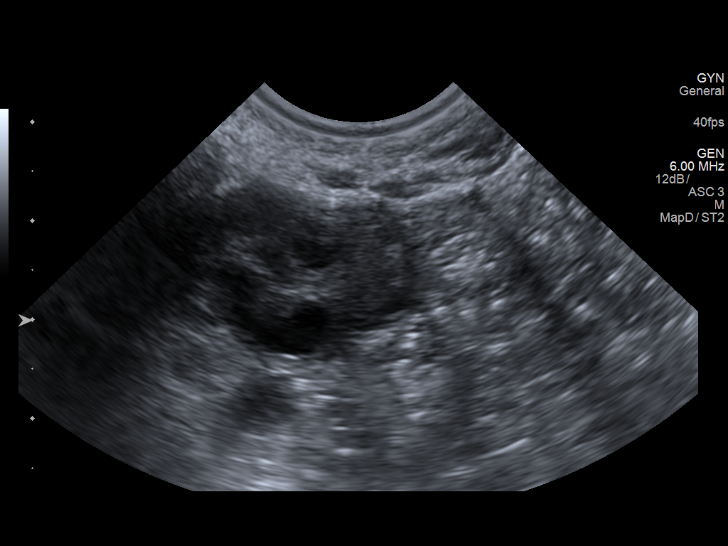
[im 96/116]
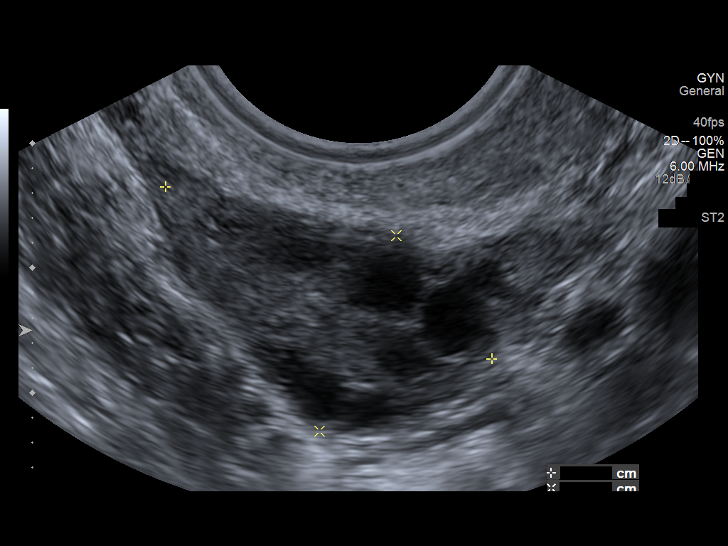
[im 106/116]
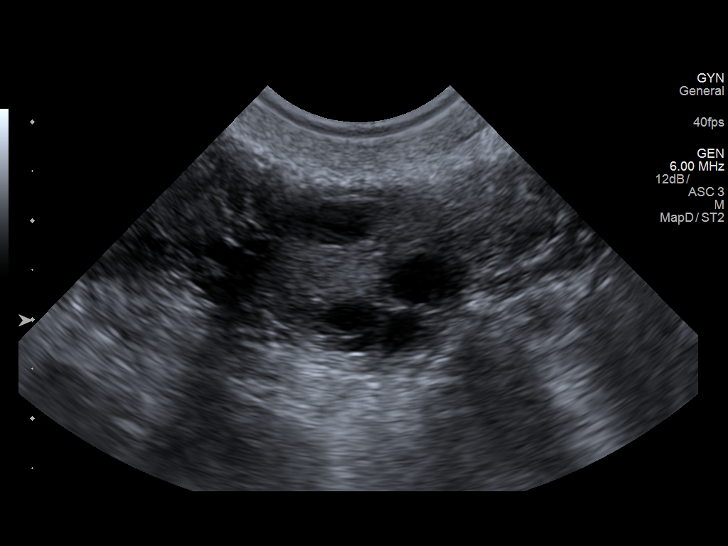
[im 116/116]
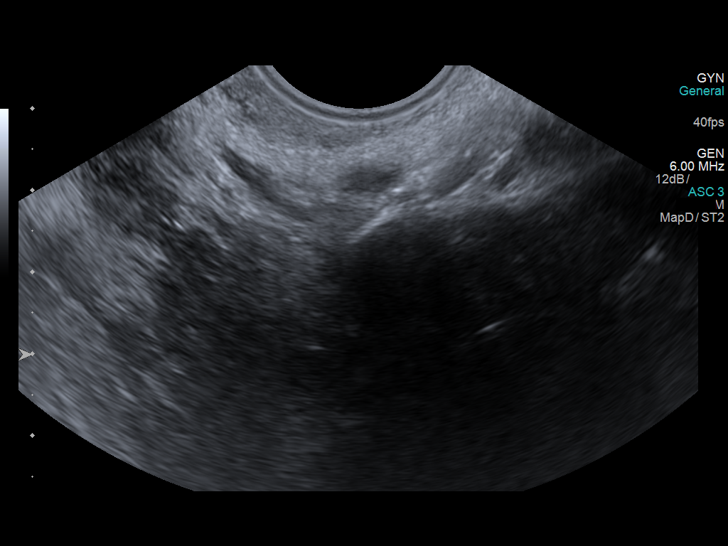

[13 of 25 positions shown; findings below may reference images not displayed]

FINDINGS: Uterus

Measurements: 8.9 x 3.6 x 4.8 cm. No fibroids or other mass
visualized. The uterus appears mildly hypervascular.

Endometrium

Thickness: 9.5 mm.  No focal abnormality visualized.

Right ovary

Measurements: 3.7 x 1.3 x 2.4 cm. Normal appearance/no adnexal mass.

Left ovary

Measurements: 3.0 x 1.7 x 2.4 cm. Normal appearance/no adnexal mass.

Other findings

There is no free pelvic fluid.
IMPRESSION: 1. Normal appearance of the uterus and endometrium. The myometrium
does appear mildly hypervascular. There are no discrete fibroids. If
bleeding remains unresponsive to hormonal or medical therapy,
sonohysterogram should be considered for focal lesion work-up. (Ref:
Radiological Reasoning: Algorithmic Workup of Abnormal Vaginal
Bleeding with Endovaginal Sonography and Sonohysterography. AJR
7226; 191:S68-73)
2. Normal appearance of the ovaries.  No free pelvic fluid.

## 2017-11-22 ENCOUNTER — Emergency Department (HOSPITAL_COMMUNITY)
Admission: EM | Admit: 2017-11-22 | Discharge: 2017-11-23 | Disposition: A | Discharge status: Home / Self Care | Payer: PRIVATE HEALTH INSURANCE | Attending: Emergency Medicine | Admitting: Emergency Medicine

## 2017-11-22 ENCOUNTER — Encounter (HOSPITAL_COMMUNITY): Payer: Self-pay | Admitting: Emergency Medicine

## 2017-11-22 ENCOUNTER — Emergency Department (HOSPITAL_COMMUNITY): Payer: PRIVATE HEALTH INSURANCE

## 2017-11-22 DIAGNOSIS — R1010 Upper abdominal pain, unspecified: Secondary | ICD-10-CM | POA: Diagnosis not present

## 2017-11-22 DIAGNOSIS — R197 Diarrhea, unspecified: Secondary | ICD-10-CM | POA: Insufficient documentation

## 2017-11-22 DIAGNOSIS — R112 Nausea with vomiting, unspecified: Secondary | ICD-10-CM

## 2017-11-22 DIAGNOSIS — R109 Unspecified abdominal pain: Secondary | ICD-10-CM | POA: Diagnosis present

## 2017-11-22 DIAGNOSIS — Z79899 Other long term (current) drug therapy: Secondary | ICD-10-CM | POA: Insufficient documentation

## 2017-11-22 DIAGNOSIS — N925 Other specified irregular menstruation: Secondary | ICD-10-CM | POA: Diagnosis not present

## 2017-11-22 DIAGNOSIS — R1110 Vomiting, unspecified: Secondary | ICD-10-CM | POA: Insufficient documentation

## 2017-11-22 LAB — URINALYSIS, ROUTINE W REFLEX MICROSCOPIC
Bilirubin Urine: NEGATIVE
GLUCOSE, UA: 50 mg/dL — AB
Ketones, ur: 20 mg/dL — AB
LEUKOCYTES UA: NEGATIVE
NITRITE: NEGATIVE
Protein, ur: 30 mg/dL — AB
SPECIFIC GRAVITY, URINE: 1.024 (ref 1.005–1.030)
pH: 8 (ref 5.0–8.0)

## 2017-11-22 LAB — COMPREHENSIVE METABOLIC PANEL
ALT: 13 U/L — AB (ref 14–54)
AST: 23 U/L (ref 15–41)
Albumin: 4.4 g/dL (ref 3.5–5.0)
Alkaline Phosphatase: 61 U/L (ref 38–126)
Anion gap: 14 (ref 5–15)
BUN: 6 mg/dL (ref 6–20)
CHLORIDE: 106 mmol/L (ref 101–111)
CO2: 18 mmol/L — ABNORMAL LOW (ref 22–32)
CREATININE: 0.73 mg/dL (ref 0.44–1.00)
Calcium: 9.4 mg/dL (ref 8.9–10.3)
GFR calc Af Amer: >60 mL/min (ref >60)
GFR calc non Af Amer: >60 mL/min (ref >60)
Glucose, Bld: 142 mg/dL — ABNORMAL HIGH (ref 65–99)
Potassium: 3.7 mmol/L (ref 3.5–5.1)
SODIUM: 138 mmol/L (ref 135–145)
Total Bilirubin: 0.5 mg/dL (ref 0.3–1.2)
Total Protein: 7.5 g/dL (ref 6.5–8.1)

## 2017-11-22 LAB — CBC
HCT: 40.6 % (ref 36.0–46.0)
Hemoglobin: 13.5 g/dL (ref 12.0–15.0)
MCH: 30.1 pg (ref 26.0–34.0)
MCHC: 33.3 g/dL (ref 30.0–36.0)
MCV: 90.4 fL (ref 78.0–100.0)
PLATELETS: 372 10*3/uL (ref 150–400)
RBC: 4.49 MIL/uL (ref 3.87–5.11)
RDW: 12.6 % (ref 11.5–15.5)
WBC: 18.1 10*3/uL — ABNORMAL HIGH (ref 4.0–10.5)

## 2017-11-22 LAB — DIFFERENTIAL
BASOS ABS: 0 10*3/uL (ref 0.0–0.1)
Basophils Relative: 0 %
Eosinophils Absolute: 0 10*3/uL (ref 0.0–0.7)
Eosinophils Relative: 0 %
LYMPHS ABS: 0.5 10*3/uL — AB (ref 0.7–4.0)
Lymphocytes Relative: 3 %
Monocytes Absolute: 0.2 10*3/uL (ref 0.1–1.0)
Monocytes Relative: 1 %
Neutro Abs: 18 10*3/uL — ABNORMAL HIGH (ref 1.7–7.7)
Neutrophils Relative %: 96 %

## 2017-11-22 LAB — I-STAT BETA HCG BLOOD, ED (MC, WL, AP ONLY): I-stat hCG, quantitative: 9.8 m[IU]/mL — ABNORMAL HIGH (ref <5)

## 2017-11-22 LAB — HCG, QUANTITATIVE, PREGNANCY: hCG, Beta Chain, Quant, S: <1 m[IU]/mL (ref <5)

## 2017-11-22 LAB — LIPASE, BLOOD: LIPASE: 21 U/L (ref 11–51)

## 2017-11-22 MED ORDER — IOPAMIDOL (ISOVUE-300) INJECTION 61%
INTRAVENOUS | Status: AC
  Administered 2017-11-22: 100 mL
  Filled 2017-11-22: qty 100

## 2017-11-22 MED ORDER — SODIUM CHLORIDE 0.9 % IV SOLN
INTRAVENOUS | Status: AC
  Administered 2017-11-22: 1000 mL via INTRAVENOUS

## 2017-11-22 MED ORDER — ONDANSETRON HCL 4 MG/2ML IJ SOLN
4.0000 mg | Freq: Once | INTRAMUSCULAR | Status: AC
  Administered 2017-11-22: 4 mg via INTRAVENOUS
  Filled 2017-11-22: qty 2

## 2017-11-22 MED ORDER — PROMETHAZINE HCL 25 MG/ML IJ SOLN
12.5000 mg | Freq: Once | INTRAMUSCULAR | Status: AC
  Administered 2017-11-22: 12.5 mg via INTRAVENOUS
  Filled 2017-11-22: qty 1

## 2017-11-22 MED ORDER — FAMOTIDINE IN NACL 20-0.9 MG/50ML-% IV SOLN
20.0000 mg | Freq: Once | INTRAVENOUS | Status: AC
  Administered 2017-11-22: 20 mg via INTRAVENOUS
  Filled 2017-11-22: qty 50

## 2017-11-22 MED ORDER — SODIUM CHLORIDE 0.9 % IV SOLN: INTRAVENOUS | Status: DC

## 2017-11-22 NOTE — ED Notes (Signed)
Spoke with [facility], they will add the differential to CBC and are running the hCG quant.

## 2017-11-22 NOTE — ED Triage Notes (Signed)
Pt reports 5 days of intermittent lower abdominal pain with emesis, states shes lost count. Pt states her last period was very irregular. Pt states she possibly could be pregnant.

## 2017-11-22 NOTE — ED Provider Notes (Signed)
[facility] Provider Note   CSN: [id number] Arrival date & time: 11/22/17  1841     History   Chief Complaint Chief Complaint  Patient presents with  . Abdominal Pain  . Emesis    HPI Jennifer Key is a 22 y.o. female who presents to the ED for abdominal pain. The pain started 5 days ago and has gotten worse with n/v. Patient unsure of LMP because it is irregular. Patient is taking OC's for birth control but she reports her menses came a week early and she is still bleeding. Patient took Pepto and Zantac for the abdominal pain without relief. Patient reports vomiting at least 20 times today.   Patient's mother came later during the visit and reports that the patient has had problems with n/v/d for years and the doctors have not been able to find the cause. Patient does have a PCP she can follow up with.  The history is provided by the patient. No language interpreter was used.  Abdominal Pain   This is a new problem. Episode onset: 5 days ago. The problem has been gradually worsening. The pain is located in the suprapubic region, RUQ and LLQ. The pain is at a severity of 9/10. Associated symptoms include anorexia, diarrhea, nausea and vomiting. Pertinent negatives include fever, constipation, dysuria, frequency and headaches. Nothing relieves the symptoms.  Emesis   Associated symptoms include abdominal pain, chills and diarrhea. Pertinent negatives include no cough, no fever and no headaches.    History reviewed. No pertinent past medical history.  Patient Active Problem List   Diagnosis Date Noted  . Recurrent cystitis 04/27/2015  . BV (bacterial vaginosis) 04/09/2015    Past Surgical History:  Procedure Laterality Date  . ELBOW SURGERY Left     OB History    No data available       Home Medications    Prior to Admission medications   Medication Sig Start Date End Date Taking? Authorizing Provider  dicyclomine (BENTYL) 10  MG/5ML syrup Take 5 mLs (10 mg total) by mouth 4 (four) times daily -  before meals and at bedtime. 04/24/16   Mechele ClaudeStacks, Warren, MD  metoCLOPramide (REGLAN) 5 MG/5ML solution Take 10 mg by mouth daily.  01/11/16 04/24/16  [provider]  pantoprazole (PROTONIX) 40 MG tablet Take 40 mg by mouth daily.  01/11/16   [provider]  polyethylene glycol powder (GLYCOLAX/MIRALAX) powder Take 17 g by mouth 2 (two) times daily as needed for moderate constipation. For constipation 12/20/15   Mechele ClaudeStacks, Warren, MD  promethazine (PHENERGAN) 12.5 MG tablet Take 1 tablet (12.5 mg total) by mouth every 6 (six) hours as needed for nausea or vomiting. 11/23/17   Janne NapoleonNeese, Hope M, NP    Family History Family History  Problem Relation Age of Onset  . Prostate cancer Maternal Grandfather   . Heart disease Maternal Grandfather   . Heart disease Maternal Grandmother   . Irritable bowel syndrome Maternal Grandmother     Social History Social History   Tobacco Use  . Smoking status: Never Smoker  . Smokeless tobacco: Never Used  Substance Use Topics  . Alcohol use: No    Alcohol/week: 0.0 oz  . Drug use: No     Allergies   Patient has no known allergies.   Review of Systems Review of Systems  Constitutional: Positive for chills. Negative for fever.  HENT: Negative.   Eyes: Negative for visual disturbance.  Respiratory: Negative for cough.  Cardiovascular: Negative for chest pain.  Gastrointestinal: Positive for abdominal pain, anorexia, diarrhea, nausea and vomiting. Negative for constipation.  Genitourinary: Negative for dysuria, frequency and urgency.  Skin: Negative for rash.  Neurological: Negative for syncope and headaches.  Psychiatric/Behavioral: Negative for confusion.     Physical Exam Updated Vital Signs BP 114/68 (BP Location: Right Arm)   Pulse 76   Temp 98.6 F (37 C)   Resp 16   Ht 5\' 7"  (1.702 m)   Wt 59 kg (130 lb)   LMP 11/14/2017   SpO2 100%   BMI 20.36 kg/m    Physical Exam  Constitutional: She appears well-developed and well-nourished. No distress.  HENT:  Head: Normocephalic and atraumatic.  Eyes: EOM are normal.  Neck: Neck supple.  Cardiovascular: Normal rate and regular rhythm.  Pulmonary/Chest: Effort normal and breath sounds normal.  Abdominal: Soft. Bowel sounds are normal. She exhibits no distension. There is tenderness in the epigastric area. There is no rebound, no guarding and no CVA tenderness.  Musculoskeletal: Normal range of motion.  Neurological: She is alert.  Skin: Skin is warm and dry.  Psychiatric: She has a normal mood and affect.  Nursing note and vitals reviewed.    ED Treatments / Results  Labs (all labs ordered are listed, but only abnormal results are displayed) Labs Reviewed  COMPREHENSIVE METABOLIC PANEL - Abnormal; Notable for the following components:      Result Value   CO2 18 (*)    Glucose, Bld 142 (*)    ALT 13 (*)    All other components within normal limits  CBC - Abnormal; Notable for the following components:   WBC 18.1 (*)    All other components within normal limits  URINALYSIS, ROUTINE W REFLEX MICROSCOPIC - Abnormal; Notable for the following components:   APPearance HAZY (*)    Glucose, UA 50 (*)    Hgb urine dipstick LARGE (*)    Ketones, ur 20 (*)    Protein, ur 30 (*)    Bacteria, UA RARE (*)    Squamous Epithelial / LPF 6-30 (*)    All other components within normal limits  DIFFERENTIAL - Abnormal; Notable for the following components:   Neutro Abs 18.0 (*)    Lymphs Abs 0.5 (*)    All other components within normal limits  I-STAT BETA HCG BLOOD, ED ([facility], [facility], [facility] ONLY) - Abnormal; Notable for the following components:   I-stat hCG, quantitative 9.8 (*)    All other components within normal limits  LIPASE, BLOOD  HCG, QUANTITATIVE, PREGNANCY  HCG, QUANTITATIVE, PREGNANCY   Radiology Ct Abdomen Pelvis W Contrast  Result Date: 11/23/2017 CLINICAL DATA:  Acute onset of  generalized abdominal pain, nausea, vomiting and diarrhea. EXAM: CT ABDOMEN AND PELVIS WITH CONTRAST TECHNIQUE: Multidetector CT imaging of the abdomen and pelvis was performed using the standard protocol following bolus administration of intravenous contrast. CONTRAST:  100 mL ISOVUE-300 IOPAMIDOL (ISOVUE-300) INJECTION 61% COMPARISON:  Abdominal radiograph performed 04/24/2016 FINDINGS: Lower chest: The visualized lung bases are grossly clear. The visualized portions of the mediastinum are unremarkable. Hepatobiliary: The liver is unremarkable in appearance. The gallbladder is unremarkable in appearance. The common bile duct remains normal in caliber. Pancreas: The pancreas is within normal limits. Spleen: The spleen is unremarkable in appearance. Adrenals/Urinary Tract: The adrenal glands are unremarkable in appearance. The kidneys are within normal limits. There is no evidence of hydronephrosis. No renal or ureteral stones are identified. No perinephric stranding is seen. Stomach/Bowel:  The stomach is unremarkable in appearance. The small bowel is within normal limits. The appendix is normal in caliber, without evidence of appendicitis. The colon is unremarkable in appearance. Vascular/Lymphatic: The abdominal aorta is unremarkable in appearance. The inferior vena cava is grossly unremarkable. No retroperitoneal lymphadenopathy is seen. No pelvic sidewall lymphadenopathy is identified. Reproductive: The bladder is decompressed and not well assessed. The uterus is unremarkable in appearance. The ovaries are grossly symmetric. No suspicious adnexal masses are seen. Other: No additional soft tissue abnormalities are seen. Musculoskeletal: No acute osseous abnormalities are identified. The visualized musculature is unremarkable in appearance. IMPRESSION: Unremarkable contrast-enhanced CT of the abdomen and pelvis. Electronically Signed   By: Roanna Raider M.D.   On: 11/23/2017 00:09    Procedures Procedures  (including critical care time)  Medications Ordered in ED Medications  0.9 %  sodium chloride infusion ( Intravenous Stopped 11/22/17 2244)  ondansetron (ZOFRAN) injection 4 mg (4 mg Intravenous Given 11/22/17 2211)  promethazine (PHENERGAN) injection 12.5 mg (12.5 mg Intravenous Given 11/22/17 2245)  famotidine (PEPCID) IVPB 20 mg premix (0 mg Intravenous Stopped 11/22/17 2315)  iopamidol (ISOVUE-300) 61 % injection (100 mLs  Contrast Given 11/22/17 2339)  sodium chloride 0.9 % bolus 500 mL (500 mLs Intravenous New Bag/Given 11/23/17 0058)   Patient's symptoms improved with IV hydration and medication for nausea/voiting.    Initial Impression / Assessment and Plan / ED Course  I have reviewed the triage vital signs and the nursing notes. Patient is nontoxic, nonseptic appearing, in no apparent distress.  Patient's pain and other symptoms adequately managed in emergency department.  Fluid bolus given.  Labs, imaging and vitals reviewed.  Patient does not meet the SIRS or Sepsis criteria.  On repeat exam patient does not have a surgical abdomin and there are no peritoneal signs.  No indication of appendicitis, bowel obstruction, bowel perforation, cholecystitis, diverticulitis. Patient initial I Stat Bhcg was elevated, however, when done in the main lab was negative.  Patient discharged home with symptomatic treatment and given strict instructions for follow-up with their primary care physician.  I have also discussed reasons to return immediately to the ER.  Patient expresses understanding and agrees with plan.    Final Clinical Impressions(s) / ED Diagnoses   Final diagnoses:  Nausea vomiting and diarrhea  Pain of upper abdomen    ED Discharge Orders        Ordered    promethazine (PHENERGAN) 12.5 MG tablet  Every 6 hours PRN     11/23/17 0126       Kerrie Buffalo Arco, NP 11/23/17 0130    Tilden Fossa, MD 11/24/17 1440

## 2017-11-23 MED ORDER — PROMETHAZINE HCL 12.5 MG PO TABS: 12.5000 mg | ORAL_TABLET | Freq: Four times a day (QID) | ORAL | 0 refills | Status: DC | PRN

## 2017-11-23 MED ORDER — SODIUM CHLORIDE 0.9 % IV BOLUS (SEPSIS)
500.0000 mL | Freq: Once | INTRAVENOUS | Status: AC
  Administered 2017-11-23: 500 mL via INTRAVENOUS

## 2017-11-23 NOTE — ED Notes (Signed)
Pt departed in NAD, refused use of wheelchair.  

## 2017-11-23 NOTE — Discharge Instructions (Signed)
Stay on clear liquids for the next 12 hours and then advance to toast, applesauce, rice, bananas, follow up with your doctor in the next few days, return here for worsening symptoms.

## 2017-11-23 NOTE — ED Notes (Signed)
Patient transported to CT 

## 2018-02-13 ENCOUNTER — Ambulatory Visit (INDEPENDENT_AMBULATORY_CARE_PROVIDER_SITE_OTHER): Payer: PRIVATE HEALTH INSURANCE | Admitting: Family Medicine

## 2018-02-13 ENCOUNTER — Encounter: Payer: Self-pay | Admitting: Family Medicine

## 2018-02-13 VITALS — BP 117/77 | HR 97 | Temp 97.2°F | Ht 67.0 in | Wt 148.1 lb

## 2018-02-13 DIAGNOSIS — N760 Acute vaginitis: Secondary | ICD-10-CM

## 2018-02-13 LAB — WET PREP FOR TRICH, YEAST, CLUE
CLUE CELL EXAM: NEGATIVE
Trichomonas Exam: NEGATIVE
YEAST EXAM: POSITIVE — AB

## 2018-02-13 NOTE — Progress Notes (Signed)
Subjective:  Patient ID: Jennifer Key, female    DOB: 1996/08/25  Age: 22 y.o. MRN: [id number]  CC: Nausea (pt here today c/o nausea and stating her menses started early but wasn't a normal menses and was "old brown" blood)   HPI Jennifer Key presents for Vaginal bleding like a period starting 1 week before menses was due. Pt. Is usually pretty regular.  She is concerned because the discharge was like old blood.  Patient also says that she is been tired more.  She started yesterday with her normal..  She is using oral contraceptives.  Her last menses started 4 weeks ago.  It was normal.  She denies any odor or vaginal discharge.  She has had no pelvic pain other than is general menstrual cramps as usual.  Depression screen [facility] 2/9 02/13/2018 11/09/2016 05/11/2016  Decreased Interest 0 0 0  Down, Depressed, Hopeless 0 0 0  PHQ - 2 Score 0 0 0    History Jennifer Key has no past medical history on file.   She has a past surgical history that includes Elbow surgery (Left).   Her family history includes Heart disease in her maternal grandfather and maternal grandmother; Irritable bowel syndrome in her maternal grandmother; Prostate cancer in her maternal grandfather.She reports that she has never smoked. She has never used smokeless tobacco. She reports that she does not drink alcohol or use drugs.    ROS Review of Systems  Constitutional: Negative.   HENT: Negative.   Eyes: Negative for visual disturbance.  Respiratory: Negative for shortness of breath.   Cardiovascular: Negative for chest pain.  Gastrointestinal: Negative for abdominal pain.  Musculoskeletal: Negative for arthralgias.    Objective:  BP 117/77   Pulse 97   Temp (!) 97.2 F (36.2 C) (Oral)   Ht 5\' 7"  (1.702 m)   Wt 148 lb 2 oz (67.2 kg)   BMI 23.20 kg/m   BP Readings from Last 3 Encounters:  02/13/18 117/77  11/23/17 (!) 157/80  11/09/16 106/70    Wt Readings from Last 3 Encounters:  02/13/18 148 lb 2  oz (67.2 kg)  11/22/17 130 lb (59 kg)  11/09/16 135 lb (61.2 kg)     Physical Exam  Constitutional: She is oriented to person, place, and time. She appears well-developed and well-nourished.  HENT:  Head: Normocephalic and atraumatic.  Cardiovascular: Normal rate and regular rhythm.  No murmur heard. Pulmonary/Chest: Effort normal and breath sounds normal.  Abdominal: Soft. Bowel sounds are normal. She exhibits no mass. There is tenderness (Mild suprapubic). There is no rebound and no guarding.  Neurological: She is alert and oriented to person, place, and time.  Skin: Skin is warm and dry.  Psychiatric: She has a normal mood and affect. Her behavior is normal.    Results for orders placed or performed in visit on 02/13/18  WET PREP FOR TRICH, YEAST, CLUE  Result Value Ref Range   Trichomonas Exam Negative Negative   Yeast Exam Positive (A) Negative   Clue Cell Exam Negative Negative  GC/Chlamydia Probe Amp  Result Value Ref Range   Chlamydia trachomatis, NAA Negative Negative   Neisseria gonorrhoeae by PCR Negative Negative     Assessment & Plan:   Korinne was seen today for nausea.  Diagnoses and all orders for this visit:  Acute vaginitis -     WET PREP FOR TRICH, YEAST, CLUE -     GC/Chlamydia Probe Amp       I am having  Jennifer Key maintain her polyethylene glycol powder, metoCLOPramide, pantoprazole, dicyclomine, and promethazine.  Allergies as of 02/13/2018   No Known Allergies     Medication List        Accurate as of 02/13/18 11:59 PM. Always use your most recent med list.          dicyclomine 10 MG/5ML syrup Commonly known as:  BENTYL Take 5 mLs (10 mg total) by mouth 4 (four) times daily -  before meals and at bedtime.   metoCLOPramide 5 MG/5ML solution Commonly known as:  REGLAN Take 10 mg by mouth daily.   polyethylene glycol powder powder Commonly known as:  GLYCOLAX/MIRALAX Take 17 g by mouth 2 (two) times daily as needed for  moderate constipation. For constipation   promethazine 12.5 MG tablet Commonly known as:  PHENERGAN Take 1 tablet (12.5 mg total) by mouth every 6 (six) hours as needed for nausea or vomiting.   PROTONIX 40 MG tablet Generic drug:  pantoprazole Take 40 mg by mouth daily.        Follow-up: Return if symptoms worsen or fail to improve.  Mechele Claude, M.D.

## 2018-02-15 LAB — GC/CHLAMYDIA PROBE AMP
Chlamydia trachomatis, NAA: NEGATIVE
NEISSERIA GONORRHOEAE BY PCR: NEGATIVE

## 2018-02-24 ENCOUNTER — Encounter: Payer: Self-pay | Admitting: Family Medicine

## 2018-03-06 ENCOUNTER — Encounter: Payer: Self-pay | Admitting: Family Medicine

## 2018-03-06 ENCOUNTER — Ambulatory Visit (INDEPENDENT_AMBULATORY_CARE_PROVIDER_SITE_OTHER): Payer: PRIVATE HEALTH INSURANCE | Admitting: Family Medicine

## 2018-03-06 VITALS — BP 116/78 | HR 96 | Temp 98.0°F | Ht 67.0 in | Wt 148.1 lb

## 2018-03-06 DIAGNOSIS — R110 Nausea: Secondary | ICD-10-CM

## 2018-03-06 NOTE — Patient Instructions (Signed)
Gluten-Free Diet for Celiac Disease, Adult The gluten-free diet includes all foods that do not contain gluten. Gluten is a protein that is found in wheat, rye, barley, and some other grains. Following the gluten-free diet is the only treatment for people with celiac disease. It helps to prevent damage to the intestines and improves or eliminates the symptoms of celiac disease. Following the gluten-free diet requires some planning. It can be challenging at first, but it gets easier with time and practice. There are more gluten-free options available today than ever before. If you need help finding gluten-free foods or if you have questions, talk with your diet and nutrition specialist (registered dietitian) or your health care provider. What do I need to know about a gluten-free diet?  All fruits, vegetables, and meats are safe to eat and do not contain gluten.  When grocery shopping, start by shopping in the produce, meat, and dairy sections. These sections are more likely to contain gluten-free foods. Then move to the aisles that contain packaged foods if you need to.  Read all food labels. Gluten is often added to foods. Always check the ingredient list and look for warnings, such as "may contain gluten."  Talk with your dietitian or health care provider before taking a gluten-free multivitamin or mineral supplement.  Be aware of gluten-free foods having contact with foods that contain gluten (cross-contamination). This can happen at home and with any processed foods. ? Talk with your health care provider or dietitian about how to reduce the risk of cross-contamination in your home. ? If you have questions about how a food is processed, ask the manufacturer. What key words help to identify gluten? Foods that list any of these key words on the label usually contain gluten:  Wheat, flour, enriched flour, bromated flour, white flour, durum flour, graham flour, phosphated flour, self-rising flour,  semolina, farina, barley (malt), rye, and oats.  Starch, dextrin, modified food starch, or cereal.  Thickening, fillers, or emulsifiers.  Malt flavoring, malt extract, or malt syrup.  Hydrolyzed vegetable protein.  In the [place] packaged foods that are gluten-free are required to be labeled "GF." These foods should be easy to identify and are safe to eat. In the [place] food companies are also required to list common food allergens, including wheat, on their labels. Recommended foods Grains  Amaranth, bean flours, 100% buckwheat flour, corn, millet, nut flours or nut meals, GF oats, quinoa, rice, sorghum, teff, rice wafers, pure cornmeal tortillas, popcorn, and hot cereals made from cornmeal. Hominy, rice, wild rice. Some Asian rice noodles or bean noodles. Arrowroot starch, corn bran, corn flour, corn germ, cornmeal, corn starch, potato flour, potato starch flour, and rice bran. Plain, brown, and sweet rice flours. Rice polish, soy flour, and tapioca starch. Vegetables  All plain fresh, frozen, and canned vegetables. Fruits  All plain fresh, frozen, canned, and dried fruits, and 100% fruit juices. Meats and other protein foods  All fresh beef, pork, poultry, fish, seafood, and eggs. Fish canned in water, oil, brine, or vegetable broth. Plain nuts and seeds, peanut butter. Some lunch meat and some frankfurters. Dried beans, dried peas, and lentils. Dairy  Fresh plain, dry, evaporated, or condensed milk. Cream, butter, sour cream, whipping cream, and most yogurts. Unprocessed cheese, most processed cheeses, some cottage cheese, some cream cheeses. Beverages  Coffee, tea, most herbal teas. Carbonated beverages and some root beers. Wine, sake, and distilled spirits, such as gin, vodka, and whiskey. Most hard ciders. Fats and oils  Butter,   margarine, vegetable oil, hydrogenated butter, olive oil, shortening, lard, cream, and some mayonnaise. Some commercial salad dressings. Olives. Sweets  and desserts  Sugar, honey, some syrups, molasses, jelly, and jam. Plain hard candy, marshmallows, and gumdrops. Pure cocoa powder. Plain chocolate. Custard and some pudding mixes. Gelatin desserts, sorbets, frozen ice pops, and sherbet. Cake, cookies, and other desserts prepared with allowed flours. Some commercial ice creams. Cornstarch, tapioca, and rice puddings. Seasoning and other foods  Some canned or frozen soups. Monosodium glutamate (MSG). Cider, rice, and wine vinegar. Baking soda and baking powder. Cream of tartar. Baking and nutritional yeast. Certain soy sauces made without wheat (ask your dietitian about specific brands that are allowed). Nuts, coconut, and chocolate. Salt, pepper, herbs, spices, flavoring extracts, imitation or artificial flavorings, natural flavorings, and food colorings. Some medicines and supplements. Some lip glosses and other cosmetics. Rice syrups. The items listed may not be a complete list. Talk with your dietitian about what dietary choices are best for you. Foods to avoid Grains  Barley, bran, bulgur, couscous, cracked wheat, McRoberts, farro, graham, malt, matzo, semolina, wheat germ, and all wheat and rye cereals including spelt and kamut. Cereals containing malt as a flavoring, such as rice cereal. Noodles, spaghetti, macaroni, most packaged rice mixes, and all mixes containing wheat, rye, barley, or triticale. Vegetables  Most creamed vegetables and most vegetables canned in sauces. Some commercially prepared vegetables and salads. Fruits  Thickened or prepared fruits and some pie fillings. Some fruit snacks and fruit roll-ups. Meats and other protein foods  Any meat or meat alternative containing wheat, rye, barley, or gluten stabilizers. These are often marinated or packaged meats and lunch meats. Bread-containing products, such as Swiss steak, croquettes, meatballs, and meatloaf. Most tuna canned in vegetable broth and turkey with hydrolyzed vegetable  protein (HVP) injected as part of the basting. Seitan. Imitation fish. Eggs in sauces made from ingredients to avoid. Dairy  Commercial chocolate milk drinks and malted milk. Some non-dairy creamers. Any cheese product containing ingredients to avoid. Beverages  Certain cereal beverages. Beer, ale, malted milk, and some root beers. Some hard ciders. Some instant flavored coffees. Some herbal teas made with barley or with barley malt added. Fats and oils  Some commercial salad dressings. Sour cream containing modified food starch. Sweets and desserts  Some toffees. Chocolate-coated nuts (may be rolled in wheat flour) and some commercial candies and candy bars. Most cakes, cookies, donuts, pastries, and other baked goods. Some commercial ice cream. Ice cream cones. Commercially prepared mixes for cakes, cookies, and other desserts. Bread pudding and other puddings thickened with flour. Products containing brown rice syrup made with barley malt enzyme. Desserts and sweets made with malt flavoring. Seasoning and other foods  Some curry powders, some dry seasoning mixes, some gravy extracts, some meat sauces, some ketchups, some prepared mustards, and horseradish. Certain soy sauces. Malt vinegar. Bouillon and bouillon cubes that contain HVP. Some chip dips, and some chewing gum. Yeast extract. Brewer's yeast. Caramel color. Some medicines and supplements. Some lip glosses and other cosmetics. The items listed may not be a complete list. Talk with your dietitian about what dietary choices are best for you. Summary  Gluten is a protein that is found in wheat, rye, barley, and some other grains. The gluten-free diet includes all foods that do not contain gluten.  If you need help finding gluten-free foods or if you have questions, talk with your diet and nutrition specialist (registered dietitian) or your health care provider.  Read   all food labels. Gluten is often added to foods. Always check the  ingredient list and look for warnings, such as "may contain gluten." This information is not intended to replace advice given to you by your health care provider. Make sure you discuss any questions you have with your health care provider. Document Released: 08/28/2005 Document Revised: 06/12/2016 Document Reviewed: 06/12/2016 Elsevier Interactive Patient Education  2018 [employer]  

## 2018-03-06 NOTE — Progress Notes (Signed)
Subjective:  Patient ID: Jennifer Key, female    DOB: [birth year]  Age: 22 y.o. MRN: [id number]  CC: Nausea (pt here today c/o nausea and trouble staying asleep)   HPI Jennifer Key presents for intermittent nausea going on since she was a teenager.  Apparently 5 to 6 years now.  It is intermittent.  No known associations.  It does not come just with her menses.  She had an upper endoscopy for this at [facility] in the past and that was negative for any causative abnormality.  She does have occasional heartburn.  Today she had several bouts of diarrhea this morning but she is about to start her regular menses and this is a common premenstrual syndrome for her is accompanied by typical cramps as well.  She has had tick bites in the past most recently she had a tick bite that she knows it is not a [place] tick but in the past she did not check for that.  She does not note an association of red meat consumption to the episodes of nausea.  Depression screen [facility] 2/9 03/06/2018 02/13/2018 11/09/2016  Decreased Interest 1 0 0  Down, Depressed, Hopeless 1 0 0  PHQ - 2 Score 2 0 0  Altered sleeping 3 - -  Tired, decreased energy 1 - -  Change in appetite 0 - -  Feeling bad or failure about yourself  0 - -  Trouble concentrating 0 - -  Moving slowly or fidgety/restless 0 - -  Suicidal thoughts 0 - -  PHQ-9 Score 6 - -  Difficult doing work/chores Somewhat difficult - -    History Helia has no past medical history on file.   She has a past surgical history that includes Elbow surgery (Left).   Her family history includes Heart disease in her maternal grandfather and maternal grandmother; Irritable bowel syndrome in her maternal grandmother; Prostate cancer in her maternal grandfather.She reports that she has never smoked. She has never used smokeless tobacco. She reports that she does not drink alcohol or use drugs.    ROS Review of Systems  Constitutional: Negative.   HENT: Negative  for congestion.   Eyes: Negative for visual disturbance.  Respiratory: Negative for shortness of breath.   Cardiovascular: Negative for chest pain.  Gastrointestinal: Positive for abdominal pain, diarrhea, nausea and vomiting. Negative for constipation.  Genitourinary: Negative for difficulty urinating.  Musculoskeletal: Negative for arthralgias and myalgias.  Neurological: Negative for headaches.  Psychiatric/Behavioral: Positive for sleep disturbance (Related to abdominal cramps during the night).    Objective:  BP 116/78   Pulse 96   Temp 98 F (36.7 C) (Oral)   Ht 5' 7"  (1.702 m)   Wt 148 lb 2 oz (67.2 kg)   BMI 23.20 kg/m   BP Readings from Last 3 Encounters:  03/06/18 116/78  02/13/18 117/77  11/23/17 (!) 157/80    Wt Readings from Last 3 Encounters:  03/06/18 148 lb 2 oz (67.2 kg)  02/13/18 148 lb 2 oz (67.2 kg)  11/22/17 130 lb (59 kg)     Physical Exam  Constitutional: She is oriented to person, place, and time. She appears well-developed and well-nourished.  HENT:  Head: Normocephalic and atraumatic.  Cardiovascular: Normal rate and regular rhythm.  No murmur heard. Pulmonary/Chest: Effort normal and breath sounds normal.  Abdominal: Soft. Bowel sounds are normal. She exhibits no mass. There is tenderness (Both lower quadrants.  Negative obturator and straight leg raise signs). There is  no rebound and no guarding.  Musculoskeletal: She exhibits no edema, tenderness or deformity.  Neurological: She is alert and oriented to person, place, and time.  Skin: Skin is warm and dry.  Psychiatric: She has a normal mood and affect. Her behavior is normal.      Assessment & Plan:   Tacara was seen today for nausea.  Diagnoses and all orders for this visit:  Chronic nausea -     CBC with Differential/Platelet -     CMP14+EGFR -     Alpha-Gal Panel -     Celiac Panel -     Ambulatory referral to Gastroenterology       I have discontinued Antonella  Lich's polyethylene glycol powder, metoCLOPramide, pantoprazole, and dicyclomine. I am also having her maintain her promethazine.  Allergies as of 03/06/2018   No Known Allergies     Medication List        Accurate as of 03/06/18 12:01 PM. Always use your most recent med list.          promethazine 12.5 MG tablet Commonly known as:  PHENERGAN Take 1 tablet (12.5 mg total) by mouth every 6 (six) hours as needed for nausea or vomiting.      I recommended she follow a gluten-free diet trial of 6 weeks.  She will need to see GI for follow-up  Follow-up: Return in about 6 weeks (around 04/17/2018).  Claretta Fraise, M.D.

## 2018-03-11 LAB — CBC WITH DIFFERENTIAL/PLATELET
BASOS: 0 %
Basophils Absolute: 0 10*3/uL (ref 0.0–0.2)
EOS (ABSOLUTE): 0 10*3/uL (ref 0.0–0.4)
EOS: 0 %
HEMATOCRIT: 42.6 % (ref 34.0–46.6)
HEMOGLOBIN: 14.1 g/dL (ref 11.1–15.9)
Immature Grans (Abs): 0.1 10*3/uL (ref 0.0–0.1)
Immature Granulocytes: 1 %
LYMPHS ABS: 2.2 10*3/uL (ref 0.7–3.1)
Lymphs: 18 %
MCH: 29.9 pg (ref 26.6–33.0)
MCHC: 33.1 g/dL (ref 31.5–35.7)
MCV: 90 fL (ref 79–97)
MONOS ABS: 0.8 10*3/uL (ref 0.1–0.9)
Monocytes: 6 %
NEUTROS ABS: 8.8 10*3/uL — AB (ref 1.4–7.0)
Neutrophils: 75 %
Platelets: 394 10*3/uL (ref 150–450)
RBC: 4.71 x10E6/uL (ref 3.77–5.28)
RDW: 12.5 % (ref 12.3–15.4)
WBC: 11.9 10*3/uL — ABNORMAL HIGH (ref 3.4–10.8)

## 2018-03-11 LAB — ALPHA-GAL PANEL
Alpha Gal IgE*: <0.1 kU/L (ref <0.10)
Class Interpretation: 0
Class Interpretation: 0
PORK CLASS INTERPRETATION: 0
Pork (Sus spp) IgE: <0.1 kU/L (ref <0.35)

## 2018-03-11 LAB — CMP14+EGFR
A/G RATIO: 1.8 (ref 1.2–2.2)
ALBUMIN: 4.7 g/dL (ref 3.5–5.5)
ALK PHOS: 66 IU/L (ref 39–117)
ALT: 8 IU/L (ref 0–32)
AST: 14 IU/L (ref 0–40)
BUN/Creatinine Ratio: 11 (ref 9–23)
BUN: 8 mg/dL (ref 6–20)
Bilirubin Total: 0.3 mg/dL (ref 0.0–1.2)
CHLORIDE: 102 mmol/L (ref 96–106)
CO2: 23 mmol/L (ref 20–29)
Calcium: 9.9 mg/dL (ref 8.7–10.2)
Creatinine, Ser: 0.71 mg/dL (ref 0.57–1.00)
GFR calc Af Amer: 140 mL/min/{1.73_m2} (ref >59)
GFR, EST NON AFRICAN AMERICAN: 121 mL/min/{1.73_m2} (ref >59)
GLOBULIN, TOTAL: 2.6 g/dL (ref 1.5–4.5)
Glucose: 91 mg/dL (ref 65–99)
POTASSIUM: 5.1 mmol/L (ref 3.5–5.2)
SODIUM: 139 mmol/L (ref 134–144)
Total Protein: 7.3 g/dL (ref 6.0–8.5)

## 2018-03-11 LAB — GLIA (IGA/G) + TTG IGA
Antigliadin Abs, IgA: 3 units (ref 0–19)
GLIADIN IGG: 2 U (ref 0–19)

## 2018-04-23 ENCOUNTER — Ambulatory Visit: Payer: PRIVATE HEALTH INSURANCE | Admitting: Family Medicine

## 2018-08-02 ENCOUNTER — Encounter: Payer: Self-pay | Admitting: Family

## 2018-08-02 ENCOUNTER — Ambulatory Visit (INDEPENDENT_AMBULATORY_CARE_PROVIDER_SITE_OTHER): Payer: PRIVATE HEALTH INSURANCE | Admitting: Family

## 2018-08-02 VITALS — BP 114/72 | HR 82 | Temp 98.8°F | Ht 67.0 in | Wt 162.0 lb

## 2018-08-02 DIAGNOSIS — A084 Viral intestinal infection, unspecified: Secondary | ICD-10-CM | POA: Diagnosis not present

## 2018-08-02 DIAGNOSIS — F411 Generalized anxiety disorder: Secondary | ICD-10-CM

## 2018-08-02 DIAGNOSIS — R110 Nausea: Secondary | ICD-10-CM | POA: Diagnosis not present

## 2018-08-02 DIAGNOSIS — R109 Unspecified abdominal pain: Secondary | ICD-10-CM | POA: Diagnosis not present

## 2018-08-02 LAB — URINALYSIS, COMPLETE
Bilirubin, UA: NEGATIVE
Glucose, UA: NEGATIVE
Ketones, UA: NEGATIVE
Leukocytes, UA: NEGATIVE
NITRITE UA: NEGATIVE
PROTEIN UA: NEGATIVE
Specific Gravity, UA: 1.02 (ref 1.005–1.030)
UUROB: 0.2 mg/dL (ref 0.2–1.0)
pH, UA: 7.5 (ref 5.0–7.5)

## 2018-08-02 LAB — MICROSCOPIC EXAMINATION
BACTERIA UA: NONE SEEN
Epithelial Cells (non renal): NONE SEEN /hpf (ref 0–10)
Renal Epithel, UA: NONE SEEN /hpf
WBC UA: NONE SEEN /HPF (ref 0–5)

## 2018-08-02 MED ORDER — PROMETHAZINE HCL 12.5 MG PO TABS: 12.5000 mg | ORAL_TABLET | Freq: Four times a day (QID) | ORAL | 0 refills | Status: DC | PRN

## 2018-08-02 MED ORDER — ESCITALOPRAM OXALATE 10 MG PO TABS: 10.0000 mg | ORAL_TABLET | Freq: Every day | ORAL | 3 refills | Status: DC

## 2018-08-02 NOTE — Patient Instructions (Signed)

## 2018-08-02 NOTE — Progress Notes (Signed)
Subjective:    Patient ID: Jennifer Lancelexandra Frasier, female    DOB: 04/26/1996, 22 y.o.   MRN: [id number]  Chief Complaint  Patient presents with  . Abdominal Pain    Abdominal Pain  This is a recurrent problem. The current episode started today. The onset quality is sudden. The problem occurs constantly. The problem has been unchanged. The pain is located in the generalized abdominal region. The pain is at a severity of 6/10. The pain is mild. The quality of the pain is cramping. The abdominal pain does not radiate. Associated symptoms include belching, diarrhea, frequency and nausea. Pertinent negatives include no constipation, dysuria or vomiting. The pain is relieved by certain positions. She has tried nothing for the symptoms. The treatment provided no relief.  Anxiety  Presents for initial visit. Onset was 1 to 5 years ago. Symptoms include decreased concentration, excessive worry, insomnia, irritability, nausea, nervous/anxious behavior and restlessness. Patient reports no panic. The severity of symptoms is moderate.        Review of Systems  Constitutional: Positive for irritability.  Gastrointestinal: Positive for abdominal pain, diarrhea and nausea. Negative for constipation and vomiting.  Genitourinary: Positive for frequency. Negative for dysuria.  Psychiatric/Behavioral: Positive for decreased concentration. The patient is nervous/anxious and has insomnia.   All other systems reviewed and are negative.      Objective:   Physical Exam  Constitutional: She is oriented to person, place, and time. She appears well-developed and well-nourished. No distress.  HENT:  Head: Normocephalic and atraumatic.  Right Ear: External ear normal.  Mouth/Throat: Oropharynx is clear and moist.  Eyes: Pupils are equal, round, and reactive to light.  Neck: Normal range of motion. Neck supple. No thyromegaly present.  Cardiovascular: Normal rate, regular rhythm, normal heart sounds and intact  distal pulses.  No murmur heard. Pulmonary/Chest: Effort normal and breath sounds normal. No respiratory distress. She has no wheezes.  Abdominal: Soft. Bowel sounds are normal. She exhibits no distension. There is generalized tenderness (mild).  Musculoskeletal: Normal range of motion. She exhibits no edema or tenderness.  Neurological: She is alert and oriented to person, place, and time. She has normal reflexes. No cranial nerve deficit.  Skin: Skin is warm and dry.  Psychiatric: Her behavior is normal. Judgment and thought content normal. Her mood appears anxious.  Vitals reviewed.     BP 114/72   Pulse 82   Temp 98.8 F (37.1 C) (Oral)   Ht 5\' 7"  (1.702 m)   Wt 162 lb (73.5 kg)   BMI 25.37 kg/m      Assessment & Plan:  Jennifer Key comes in today with chief complaint of Abdominal Pain   Diagnosis and orders addressed:  1. Abdominal pain, unspecified abdominal location - Urinalysis, Complete  2. Nausea - promethazine (PHENERGAN) 12.5 MG tablet; Take 1 tablet (12.5 mg total) by mouth every 6 (six) hours as needed for nausea or vomiting.  Dispense: 30 tablet; Refill: 0  3. Viral gastroenteritis Force fluids Bland diet Note given to return to work on 08/04/18 - promethazine (PHENERGAN) 12.5 MG tablet; Take 1 tablet (12.5 mg total) by mouth every 6 (six) hours as needed for nausea or vomiting.  Dispense: 30 tablet; Refill: 0  4. GAD (generalized anxiety disorder) Will start lexapro 10 mg today Stress management disucssed - escitalopram (LEXAPRO) 10 MG tablet; Take 1 tablet (10 mg total) by mouth daily.  Dispense: 90 tablet; Refill: 3    Follow up plan: 6 weeks to recheck GAD  Evelina Dun, FNP

## 2018-08-02 NOTE — Addendum Note (Signed)
Addended by: Jannifer RodneyHAWKS, Sean Macwilliams A on: 08/02/2018 10:42 AM   Modules accepted: Orders

## 2018-08-07 ENCOUNTER — Encounter: Payer: Self-pay | Admitting: Family Medicine

## 2018-08-07 ENCOUNTER — Ambulatory Visit (INDEPENDENT_AMBULATORY_CARE_PROVIDER_SITE_OTHER): Payer: PRIVATE HEALTH INSURANCE | Admitting: Family Medicine

## 2018-08-07 VITALS — BP 128/74 | HR 94 | Temp 97.2°F | Ht 67.0 in | Wt 158.0 lb

## 2018-08-07 DIAGNOSIS — R112 Nausea with vomiting, unspecified: Secondary | ICD-10-CM | POA: Diagnosis not present

## 2018-08-07 DIAGNOSIS — A084 Viral intestinal infection, unspecified: Secondary | ICD-10-CM

## 2018-08-07 LAB — PREGNANCY, URINE: PREG TEST UR: NEGATIVE

## 2018-08-07 MED ORDER — PROMETHAZINE HCL 12.5 MG RE SUPP: 12.5000 mg | Freq: Four times a day (QID) | RECTAL | 0 refills | Status: DC | PRN

## 2018-08-07 NOTE — Patient Instructions (Signed)
Food Choices to Help Relieve Diarrhea, Adult  When you have diarrhea, the foods you eat and your eating habits are very important. Choosing the right foods and drinks can help:  · Relieve diarrhea.  · Replace lost fluids and nutrients.  · Prevent dehydration.    What general guidelines should I follow?  Relieving diarrhea  · Choose foods with less than 2 g or .07 oz. of fiber per serving.  · Limit fats to less than 8 tsp (38 g or 1.34 oz.) a day.  · Avoid the following:  ? Foods and beverages sweetened with high-fructose corn syrup, honey, or sugar alcohols such as xylitol, sorbitol, and mannitol.  ? Foods that contain a lot of fat or sugar.  ? Fried, greasy, or spicy foods.  ? High-fiber grains, breads, and cereals.  ? Raw fruits and vegetables.  · Eat foods that are rich in probiotics. These foods include dairy products such as yogurt and fermented milk products. They help increase healthy bacteria in the stomach and intestines (gastrointestinal tract, or GI tract).  · If you have lactose intolerance, avoid dairy products. These may make your diarrhea worse.  · Take medicine to help stop diarrhea (antidiarrheal medicine) only as told by your health care provider.  Replacing nutrients  · Eat small meals or snacks every 3–4 hours.  · Eat bland foods, such as white rice, toast, or baked potato, until your diarrhea starts to get better. Gradually reintroduce nutrient-rich foods as tolerated or as told by your health care provider. This includes:  ? Well-cooked protein foods.  ? Peeled, seeded, and soft-cooked fruits and vegetables.  ? Low-fat dairy products.  · Take vitamin and mineral supplements as told by your health care provider.  Preventing dehydration    · Start by sipping water or a special solution to prevent dehydration (oral rehydration solution, ORS). Urine that is clear or pale yellow means that you are getting enough fluid.  · Try to drink at least 8–10 cups of fluid each day to help replace lost  fluids.  · You may add other liquids in addition to water, such as clear juice or decaffeinated sports drinks, as tolerated or as told by your health care provider.  · Avoid drinks with caffeine, such as coffee, tea, or soft drinks.  · Avoid alcohol.  What foods are recommended?  The items listed may not be a complete list. Talk with your health care provider about what dietary choices are best for you.  Grains  White rice. White, French, or pita breads (fresh or toasted), including plain rolls, buns, or bagels. White pasta. Saltine, soda, or graham crackers. Pretzels. Low-fiber cereal. Cooked cereals made with water (such as cornmeal, farina, or cream cereals). Plain muffins. Matzo. Melba toast. Zwieback.  Vegetables  Potatoes (without the skin). Most well-cooked and canned vegetables without skins or seeds. Tender lettuce.  Fruits  Apple sauce. Fruits canned in juice. Cooked apricots, cherries, grapefruit, peaches, pears, or plums. Fresh bananas and cantaloupe.  Meats and other protein foods  Baked or boiled chicken. Eggs. Tofu. Fish. Seafood. Smooth nut butters. Ground or well-cooked tender beef, ham, veal, lamb, pork, or poultry.  Dairy  Plain yogurt, kefir, and unsweetened liquid yogurt. Lactose-free milk, buttermilk, skim milk, or soy milk. Low-fat or nonfat hard cheese.  Beverages  Water. Low-calorie sports drinks. Fruit juices without pulp. Strained tomato and vegetable juices. Decaffeinated teas. Sugar-free beverages not sweetened with sugar alcohols. Oral rehydration solutions, if approved by your health care   provider.  Seasoning and other foods  Bouillon, broth, or soups made from recommended foods.  What foods are not recommended?  The items listed may not be a complete list. Talk with your health care provider about what dietary choices are best for you.  Grains  Whole grain, whole wheat, bran, or rye breads, rolls, pastas, and crackers. Wild or brown rice. Whole grain or bran cereals. Barley. Oats  and oatmeal. Corn tortillas or taco shells. Granola. Popcorn.  Vegetables  Raw vegetables. Fried vegetables. Cabbage, broccoli, Brussels sprouts, artichokes, baked beans, beet greens, corn, kale, legumes, peas, sweet potatoes, and yams. Potato skins. Cooked spinach and cabbage.  Fruits  Dried fruit, including raisins and dates. Raw fruits. Stewed or dried prunes. Canned fruits with syrup.  Meat and other protein foods  Fried or fatty meats. Deli meats. Chunky nut butters. Nuts and seeds. Beans and lentils. Bacon. Hot dogs. Sausage.  Dairy  High-fat cheeses. Whole milk, chocolate milk, and beverages made with milk, such as milk shakes. Half-and-half. Cream. sour cream. Ice cream.  Beverages  Caffeinated beverages (such as coffee, tea, soda, or energy drinks). Alcoholic beverages. Fruit juices with pulp. Prune juice. Soft drinks sweetened with high-fructose corn syrup or sugar alcohols. High-calorie sports drinks.  Fats and oils  Butter. Cream sauces. Margarine. Salad oils. Plain salad dressings. Olives. Avocados. Mayonnaise.  Sweets and desserts  Sweet rolls, doughnuts, and sweet breads. Sugar-free desserts sweetened with sugar alcohols such as xylitol and sorbitol.  Seasoning and other foods  Honey. Hot sauce. Chili powder. Gravy. Cream-based or milk-based soups. Pancakes and waffles.  Summary  · When you have diarrhea, the foods you eat and your eating habits are very important.  · Make sure you get at least 8–10 cups of fluid each day, or enough to keep your urine clear or pale yellow.  · Eat bland foods and gradually reintroduce healthy, nutrient-rich foods as tolerated, or as told by your health care provider.  · Avoid high-fiber, fried, greasy, or spicy foods.  This information is not intended to replace advice given to you by your health care provider. Make sure you discuss any questions you have with your health care provider.  Document Released: 11/18/2003 Document Revised: 08/25/2016 Document  Reviewed: 08/25/2016  Elsevier Interactive Patient Education © 2018 [employer] Inc[employer]

## 2018-08-07 NOTE — Progress Notes (Signed)
Subjective:    Patient ID: Jennifer Key, female    DOB: Apr 22, 1996, 22 y.o.   MRN: [id number]  Chief Complaint:  Nausea, vomiting, diarrhea worsening   HPI: Jennifer Key is a 22 y.o. female presenting on 08/07/2018 for Nausea, vomiting, diarrhea worsening  Pt presents today with complaints of continued nausea, vomiting, and diarrhea. This is an ongoing problem. The current episode started 08/02/18. The problem occurs mostly in the morning and improves by the evening. The problem has been waxing and waning. Pt reports abdominal cramping with the n/v/d. The quality of the pain is described as cramping. The pain is at a severity of 6/10. The pain is worse with eating and drinking. Associated symptoms include chills and fatigue. Pt denies fever, weakness, decreased urination, dysuria, chest pain, palpitations, or shortness of breath. Pt has tried oral phenergan for the symptoms. The treatment provided no relief because she was unable to hold the medication down.    Relevant past medical, surgical, family, and social history reviewed and updated as indicated.  Allergies and medications reviewed and updated.   History reviewed. No pertinent past medical history.  Past Surgical History:  Procedure Laterality Date  . ELBOW SURGERY Left     Social History   Socioeconomic History  . Marital status: Single    Spouse name: Not on file  . Number of children: 0  . Years of education: Not on file  . Highest education level: Not on file  Occupational History  . Occupation: Physiological scientist  Social Needs  . Financial resource strain: Not on file  . Food insecurity:    Worry: Not on file    Inability: Not on file  . Transportation needs:    Medical: Not on file    Non-medical: Not on file  Tobacco Use  . Smoking status: Never Smoker  . Smokeless tobacco: Never Used  Substance and Sexual Activity  . Alcohol use: No    Alcohol/week: 0.0 standard drinks  . Drug use: No  . Sexual  activity: Yes    Birth control/protection: Condom  Lifestyle  . Physical activity:    Days per week: Not on file    Minutes per session: Not on file  . Stress: Not on file  Relationships  . Social connections:    Talks on phone: Not on file    Gets together: Not on file    Attends religious service: Not on file    Active member of club or organization: Not on file    Attends meetings of clubs or organizations: Not on file    Relationship status: Not on file  . Intimate partner violence:    Fear of current or ex partner: Not on file    Emotionally abused: Not on file    Physically abused: Not on file    Forced sexual activity: Not on file  Other Topics Concern  . Not on file  Social History Narrative  . Not on file    Outpatient Encounter Medications as of 08/07/2018  Medication Sig  . escitalopram (LEXAPRO) 10 MG tablet Take 1 tablet (10 mg total) by mouth daily.  . promethazine (PHENERGAN) 12.5 MG tablet Take 1 tablet (12.5 mg total) by mouth every 6 (six) hours as needed for nausea or vomiting.  . promethazine (PHENERGAN) 12.5 MG suppository Place 1 suppository (12.5 mg total) rectally every 6 (six) hours as needed for nausea or vomiting.   No facility-administered encounter medications on file as of 08/07/2018.  No Known Allergies  Review of Systems  Constitutional: Positive for chills and fatigue. Negative for fever.  Respiratory: Negative for cough, chest tightness and shortness of breath.   Cardiovascular: Negative for chest pain, palpitations and leg swelling.  Gastrointestinal: Positive for abdominal pain, diarrhea, nausea and vomiting. Negative for abdominal distention, anal bleeding, blood in stool, constipation and rectal pain.  Genitourinary: Positive for vaginal bleeding (started menses today). Negative for decreased urine volume, difficulty urinating, dyspareunia, dysuria, enuresis, flank pain, frequency, genital sores, hematuria, menstrual problem, pelvic  pain, urgency, vaginal discharge and vaginal pain.  Musculoskeletal: Negative for myalgias.  Neurological: Negative for dizziness, syncope, weakness, light-headedness and headaches.  Psychiatric/Behavioral: Negative for confusion.  All other systems reviewed and are negative.       Objective:    BP 128/74 (BP Location: Left Arm, Cuff Size: Normal)   Pulse 94   Temp (!) 97.2 F (36.2 C) (Oral)   Ht _0  (1.702 m)   Wt 158 lb (71.7 kg)   LMP 08/07/2018 (Exact Date)   BMI 24.75 kg/m    Wt Readings from Last 3 Encounters:  08/07/18 158 lb (71.7 kg)  08/02/18 162 lb (73.5 kg)  03/06/18 148 lb 2 oz (67.2 kg)    Physical Exam  Constitutional: She is oriented to person, place, and time. She appears well-developed and well-nourished. She is cooperative. No distress.  HENT:  Head: Normocephalic.  Mouth/Throat: Uvula is midline, oropharynx is clear and moist and mucous membranes are normal. Mucous membranes are not pale.  Eyes: Conjunctivae are normal.  Neck: Neck supple.  Cardiovascular: Normal rate, regular rhythm and normal heart sounds. Exam reveals no gallop and no friction rub.  No murmur heard. Pulmonary/Chest: Effort normal and breath sounds normal. No respiratory distress.  Abdominal: Soft. Normal appearance and bowel sounds are normal. She exhibits no distension. There is no hepatosplenomegaly. There is generalized tenderness. There is no rigidity, no rebound, no guarding, no CVA tenderness, no tenderness at McBurney's point and negative Murphy's sign. No hernia.  Neurological: She is alert and oriented to person, place, and time.  Skin: Skin is warm and dry. Capillary refill takes less than 2 seconds. No pallor.  Psychiatric: She has a normal mood and affect. Her speech is normal and behavior is normal. Judgment and thought content normal. Cognition and memory are normal.  Nursing note and vitals reviewed.   Results for orders placed or performed in visit on 08/02/18    Microscopic Examination  Result Value Ref Range   WBC, UA None seen 0 - 5 /hpf   RBC, UA 0-2 0 - 2 /hpf   Epithelial Cells (non renal) None seen 0 - 10 /hpf   Renal Epithel, UA None seen None seen /hpf   Bacteria, UA None seen None seen/Few  Urinalysis, Complete  Result Value Ref Range   Specific Gravity, UA 1.020 1.005 - 1.030   pH, UA 7.5 5.0 - 7.5   Color, UA Yellow Yellow   Appearance Ur Clear Clear   Leukocytes, UA Negative Negative   Protein, UA Negative Negative/Trace   Glucose, UA Negative Negative   Ketones, UA Negative Negative   RBC, UA Trace (A) Negative   Bilirubin, UA Negative Negative   Urobilinogen, Ur 0.2 0.2 - 1.0 mg/dL   Nitrite, UA Negative Negative   Microscopic Examination See below:      Urine pregnancy test negative in office.   Pertinent labs & imaging results that were available during my care of the  patient were reviewed by me and considered in my medical decision making.  Assessment & Plan:  Jennifer Key was seen today for nausea, vomiting, diarrhea worsening.  Diagnoses and all orders for this visit:  Nausea and vomiting, intractability of vomiting not specified, unspecified vomiting type Pt continues to have nausea, vomiting, and some diarrhea. Nausea and vomiitng are worse in the mornings. Unable to keep oral phenergan down, will prescribe phenergan suppository. Will check CBC and CMP today. Urine pregnancy completed.  BRAT diet, advance as tolerated. Take small frequent sips of Gatorade or Pedialyte. Return if symptoms worsen or fail to improve as discussed. -     Pregnancy, urine -     CMP14+EGFR -     CBC with Differential/Platelet -     promethazine (PHENERGAN) 12.5 MG suppository; Place 1 suppository (12.5 mg total) rectally every 6 (six) hours as needed for nausea or vomiting.  Viral gastroenteritis Pt continues to have nausea, vomiting, and some diarrhea. Unable to keep oral phenergan down, will prescribe phenergan suppository.  BRAT  diet, advance as tolerated. Take small frequent sips of Gatorade or Pedialyte. Return if symptoms worsen or fail to improve as discussed.     Continue all other maintenance medications.  Follow up plan: Return in about 4 weeks (around 09/04/2018), or if symptoms worsen or fail to improve.  Educational handout given for gastroenteritis   The above assessment and management plan was discussed with the patient. The patient verbalized understanding of and has agreed to the management plan. Patient is aware to call the clinic if symptoms persist or worsen. Patient is aware when to return to the clinic for a follow-up visit. Patient educated on when it is appropriate to go to the emergency department.   Monia Pouch, FNP-C [facility] [phone]

## 2018-08-08 LAB — CBC WITH DIFFERENTIAL/PLATELET
Basophils Absolute: 0 10*3/uL (ref 0.0–0.2)
Basos: 0 %
EOS (ABSOLUTE): 0 10*3/uL (ref 0.0–0.4)
Eos: 0 %
HEMOGLOBIN: 13.6 g/dL (ref 11.1–15.9)
Hematocrit: 39 % (ref 34.0–46.6)
IMMATURE GRANS (ABS): 0 10*3/uL (ref 0.0–0.1)
Immature Granulocytes: 0 %
LYMPHS: 21 %
Lymphocytes Absolute: 2.3 10*3/uL (ref 0.7–3.1)
MCH: 30.1 pg (ref 26.6–33.0)
MCHC: 34.9 g/dL (ref 31.5–35.7)
MCV: 86 fL (ref 79–97)
MONOCYTES: 5 %
Monocytes Absolute: 0.6 10*3/uL (ref 0.1–0.9)
Neutrophils Absolute: 7.9 10*3/uL — ABNORMAL HIGH (ref 1.4–7.0)
Neutrophils: 74 %
Platelets: 375 10*3/uL (ref 150–450)
RBC: 4.52 x10E6/uL (ref 3.77–5.28)
RDW: 12.2 % — ABNORMAL LOW (ref 12.3–15.4)
WBC: 10.7 10*3/uL (ref 3.4–10.8)

## 2018-08-08 LAB — CMP14+EGFR
ALT: 10 IU/L (ref 0–32)
AST: 13 IU/L (ref 0–40)
Albumin/Globulin Ratio: 2.2 (ref 1.2–2.2)
Albumin: 4.9 g/dL (ref 3.5–5.5)
Alkaline Phosphatase: 87 IU/L (ref 39–117)
BILIRUBIN TOTAL: 0.3 mg/dL (ref 0.0–1.2)
BUN/Creatinine Ratio: 10 (ref 9–23)
BUN: 7 mg/dL (ref 6–20)
CHLORIDE: 103 mmol/L (ref 96–106)
CO2: 20 mmol/L (ref 20–29)
CREATININE: 0.73 mg/dL (ref 0.57–1.00)
Calcium: 9.7 mg/dL (ref 8.7–10.2)
GFR calc non Af Amer: 116 mL/min/{1.73_m2} (ref >59)
GFR, EST AFRICAN AMERICAN: 134 mL/min/{1.73_m2} (ref >59)
GLUCOSE: 92 mg/dL (ref 65–99)
Globulin, Total: 2.2 g/dL (ref 1.5–4.5)
Potassium: 3.9 mmol/L (ref 3.5–5.2)
Sodium: 141 mmol/L (ref 134–144)
Total Protein: 7.1 g/dL (ref 6.0–8.5)

## 2018-09-19 ENCOUNTER — Ambulatory Visit: Payer: PRIVATE HEALTH INSURANCE | Admitting: Family

## 2021-08-28 ENCOUNTER — Telehealth: Payer: BC Managed Care – PPO

## 2021-09-01 ENCOUNTER — Ambulatory Visit: Payer: PRIVATE HEALTH INSURANCE | Admitting: Family Medicine

## 2021-09-06 ENCOUNTER — Encounter: Payer: Self-pay | Admitting: Family Medicine

## 2021-09-06 ENCOUNTER — Ambulatory Visit: Payer: BC Managed Care – PPO | Admitting: Family Medicine

## 2021-09-06 VITALS — BP 122/80 | HR 77 | Temp 98.1°F | Ht 67.0 in | Wt 183.0 lb

## 2021-09-06 DIAGNOSIS — J069 Acute upper respiratory infection, unspecified: Secondary | ICD-10-CM

## 2021-09-06 DIAGNOSIS — L720 Epidermal cyst: Secondary | ICD-10-CM | POA: Diagnosis not present

## 2021-09-06 NOTE — Patient Instructions (Signed)
Epidermoid Cyst An epidermoid cyst, also called an epidermal cyst, is a small lump under your skin. The cyst contains a substance called keratin. Do not try to pop or open the cyst yourself. What are the causes? A blocked hair follicle. A hair that curls and re-enters the skin instead of growing straight out of the skin. A blocked pore. Irritated skin. An injury to the skin. Certain conditions that are passed along from parent to child. Human papillomavirus (HPV). This happens rarely when cysts occur on the bottom of the feet. Long-term sun damage to the skin. What increases the risk? Having acne. Being female. Having an injury to the skin. Being past puberty. Having certain conditions caused by genes (genetic disorder) What are the signs or symptoms? These cysts are usually harmless, but they can get infected. Symptoms of infection may include: Redness. Inflammation. Tenderness. Warmth. Fever. A bad-smelling substance that drains from the cyst. Pus that drains from the cyst. How is this treated? In many cases, epidermoid cysts go away on their own without treatment. If a cyst becomes infected, treatment may include: Opening and draining the cyst, done by a doctor. After draining, you may need minor surgery to remove the rest of the cyst. Antibiotic medicine. Shots of medicines (steroids) that help to reduce inflammation. Surgery to remove the cyst. Surgery may be done if the cyst: Becomes large. Bothers you. Has a chance of turning into cancer. Do not try to open a cyst yourself. Follow these instructions at home: General instructions Keep the area around your cyst clean and dry. Wear loose, dry clothing. Avoid touching your cyst. Check your cyst every day for signs of infection. Check for: Redness, swelling, or pain. Fluid or blood. Warmth. Pus or a bad smell. Keep all follow-up visits. How is this prevented? Wear clean, dry, clothing. Avoid wearing tight  clothing. Keep your skin clean and dry. Take showers or baths every day. Contact a doctor if: Your cyst has symptoms of infection. Your condition does not improve or gets worse. You have a cyst that looks different from other cysts you have had. You have a fever. Get help right away if: Redness spreads from the cyst into the area close by. Summary An epidermoid cyst is a small lump under your skin. If a cyst becomes infected, treatment may include surgery to open and drain the cyst, or to remove it. Take over-the-counter and prescription medicines only as told by your doctor. Contact a doctor if your condition is not improving or is getting worse. Keep all follow-up visits. This information is not intended to replace advice given to you by your health care provider. Make sure you discuss any questions you have with your health care provider.

## 2021-09-06 NOTE — Progress Notes (Signed)
Subjective:  Patient ID: Jennifer Key, adult    DOB: 29-Oct-1995, 25 y.o.   MRN: [id number]  Patient Care Team: Baruch Gouty, FNP as PCP - General (Family Medicine)   Chief Complaint:  New Patient (Initial Visit) (congestion)   HPI: Jennifer Key is a 25 y.o. adult presenting on 09/06/2021 for New Patient (Initial Visit) (congestion)   Patient presents today to reestablish care and for evaluation of upper respiratory symptoms and a cyst on back.  States cough, congestion, and headache has been present for several days but is improving.  Would like to be tested for influenza and COVID.  Has been managing at home with over-the-counter cough medications, significant improvement in symptoms. States cyst on back will come and go.  It is red and inflamed at times and gets larger in size.  Cyst has returned to normal size without discoloration at this time.    Relevant past medical, surgical, family, and social history reviewed and updated as indicated.  Allergies and medications reviewed and updated. Data reviewed: Chart in Epic.   History reviewed. No pertinent past medical history.  Past Surgical History:  Procedure Laterality Date   ELBOW SURGERY Left    SALPINGECTOMY Bilateral     Social History   Socioeconomic History   Marital status: Single    Spouse name: Not on file   Number of children: 0   Years of education: Not on file   Highest education level: Not on file  Occupational History   Occupation: Physiological scientist  Tobacco Use   Smoking status: Never   Smokeless tobacco: Never  Substance and Sexual Activity   Alcohol use: No    Alcohol/week: 0.0 standard drinks   Drug use: No   Sexual activity: Yes    Birth control/protection: Condom  Other Topics Concern   Not on file  Social History Narrative   Not on file   Social Determinants of Health   Financial Resource Strain: Not on file  Food Insecurity: Not on file  Transportation Needs: Not on file   Physical Activity: Not on file  Stress: Not on file  Social Connections: Not on file  Intimate Partner Violence: Not on file    Outpatient Encounter Medications as of 09/06/2021  Medication Sig   [DISCONTINUED] escitalopram (LEXAPRO) 10 MG tablet Take 1 tablet (10 mg total) by mouth daily.   [DISCONTINUED] promethazine (PHENERGAN) 12.5 MG suppository Place 1 suppository (12.5 mg total) rectally every 6 (six) hours as needed for nausea or vomiting.   [DISCONTINUED] promethazine (PHENERGAN) 12.5 MG tablet Take 1 tablet (12.5 mg total) by mouth every 6 (six) hours as needed for nausea or vomiting.   No facility-administered encounter medications on file as of 09/06/2021.    No Known Allergies  Review of Systems  Constitutional:  Negative for activity change, appetite change, chills, diaphoresis, fatigue, fever and unexpected weight change.  HENT:  Positive for congestion, postnasal drip and rhinorrhea.   Eyes: Negative.   Respiratory:  Positive for cough. Negative for choking, chest tightness, shortness of breath, wheezing and stridor.   Cardiovascular:  Negative for chest pain, palpitations and leg swelling.  Gastrointestinal:  Negative for abdominal pain, blood in stool, constipation, diarrhea, nausea and vomiting.  Endocrine: Negative.   Genitourinary:  Negative for difficulty urinating, dysuria, frequency and urgency.  Musculoskeletal:  Negative for arthralgias and myalgias.  Skin:  Positive for color change. Negative for pallor, rash and wound.  Allergic/Immunologic: Negative.   Neurological:  Positive for headaches. Negative for dizziness, tremors, seizures, syncope, facial asymmetry, speech difficulty, weakness, light-headedness and numbness.  Hematological: Negative.   Psychiatric/Behavioral:  Negative for confusion, hallucinations, sleep disturbance and suicidal ideas.   All other systems reviewed and are negative.      Objective:  BP 122/80    Pulse 77    Temp 98.1 F  (36.7 C)    Ht 5' 7" (1.702 m)    Wt 183 lb (83 kg)    LMP 08/23/2021 (Approximate)    BMI 28.66 kg/m    Wt Readings from Last 3 Encounters:  09/06/21 183 lb (83 kg)  08/07/18 158 lb (71.7 kg)  08/02/18 162 lb (73.5 kg)    Physical Exam Vitals and nursing note reviewed.  Constitutional:      Appearance: Normal appearance. Jennifer Kennon Rounds "Cristie Hem" is overweight.  HENT:     Head: Normocephalic and atraumatic.     Right Ear: Tympanic membrane, ear canal and external ear normal.     Left Ear: Tympanic membrane, ear canal and external ear normal.     Nose: Congestion present.     Mouth/Throat:     Mouth: Mucous membranes are moist.     Pharynx: Posterior oropharyngeal erythema present. No pharyngeal swelling, oropharyngeal exudate or uvula swelling.     Tonsils: No tonsillar exudate or tonsillar abscesses.     Comments: Cavernous tonsils. Eyes:     Extraocular Movements: Extraocular movements intact.     Conjunctiva/sclera: Conjunctivae normal.     Pupils: Pupils are equal, round, and reactive to light.  Cardiovascular:     Rate and Rhythm: Normal rate and regular rhythm.     Heart sounds: Normal heart sounds.  Pulmonary:     Effort: Pulmonary effort is normal.     Breath sounds: Normal breath sounds.  Musculoskeletal:     Cervical back: Normal range of motion and neck supple.     Right lower leg: No edema.     Left lower leg: No edema.  Skin:    General: Skin is warm and dry.     Capillary Refill: Capillary refill takes less than 2 seconds.     Findings: Lesion present.       Neurological:     General: No focal deficit present.     Mental Status: Jennifer Devora "Cristie Hem" is alert and oriented to person, place, and time.  Psychiatric:        Mood and Affect: Mood normal.        Behavior: Behavior normal.        Thought Content: Thought content normal.        Judgment: Judgment normal.    Results for orders placed or performed in visit on 08/07/18  Pregnancy, urine   Result Value Ref Range   Preg Test, Ur Negative Negative  CMP14+EGFR  Result Value Ref Range   Glucose 92 65 - 99 mg/dL   BUN 7 6 - 20 mg/dL   Creatinine, Ser 0.73 0.57 - 1.00 mg/dL   GFR calc non Af Amer 116 >59 mL/min/1.73   GFR calc Af Amer 134 >59 mL/min/1.73   BUN/Creatinine Ratio 10 9 - 23   Sodium 141 134 - 144 mmol/L   Potassium 3.9 3.5 - 5.2 mmol/L   Chloride 103 96 - 106 mmol/L   CO2 20 20 - 29 mmol/L   Calcium 9.7 8.7 - 10.2 mg/dL   Total Protein 7.1 6.0 - 8.5 g/dL   Albumin 4.9 3.5 - 5.5  g/dL   Globulin, Total 2.2 1.5 - 4.5 g/dL   Albumin/Globulin Ratio 2.2 1.2 - 2.2   Bilirubin Total 0.3 0.0 - 1.2 mg/dL   Alkaline Phosphatase 87 39 - 117 IU/L   AST 13 0 - 40 IU/L   ALT 10 0 - 32 IU/L  CBC with Differential/Platelet  Result Value Ref Range   WBC 10.7 3.4 - 10.8 x10E3/uL   RBC 4.52 3.77 - 5.28 x10E6/uL   Hemoglobin 13.6 11.1 - 15.9 g/dL   Hematocrit 39.0 34.0 - 46.6 %   MCV 86 79 - 97 fL   MCH 30.1 26.6 - 33.0 pg   MCHC 34.9 31.5 - 35.7 g/dL   RDW 12.2 (L) 12.3 - 15.4 %   Platelets 375 150 - 450 x10E3/uL   Neutrophils 74 Not Estab. %   Lymphs 21 Not Estab. %   Monocytes 5 Not Estab. %   Eos 0 Not Estab. %   Basos 0 Not Estab. %   Neutrophils Absolute 7.9 (H) 1.4 - 7.0 x10E3/uL   Lymphocytes Absolute 2.3 0.7 - 3.1 x10E3/uL   Monocytes Absolute 0.6 0.1 - 0.9 x10E3/uL   EOS (ABSOLUTE) 0.0 0.0 - 0.4 x10E3/uL   Basophils Absolute 0.0 0.0 - 0.2 x10E3/uL   Immature Granulocytes 0 Not Estab. %   Immature Grans (Abs) 0.0 0.0 - 0.1 x10E3/uL       Pertinent labs & imaging results that were available during my care of the patient were reviewed by me and considered in my medical decision making.  Assessment & Plan:  Jennifer Key was seen today for new patient (initial visit).  Diagnoses and all orders for this visit:  URI with cough and congestion Symptoms are improving.  Will test for COVID, influenza, and RSV.  Patient aware to continue symptomatic care at home  and to report any new or worsening symptoms. -     COVID-19, Flu A+B and RSV  Epidermoid cyst of skin of back No inflammation or redness of cyst at this time.  Patient aware to report any return of symptoms.    Continue all other maintenance medications.  Follow up plan: Return in about 3 months (around 12/05/2021), or if symptoms worsen or fail to improve, for CPE.   Continue healthy lifestyle choices, including diet (rich in fruits, vegetables, and lean proteins, and low in salt and simple carbohydrates) and exercise (at least 30 minutes of moderate physical activity daily).  Educational handout given for epidermoid cyst, URI  The above assessment and management plan was discussed with the patient. The patient verbalized understanding of and has agreed to the management plan. Patient is aware to call the clinic if they develop any new symptoms or if symptoms persist or worsen. Patient is aware when to return to the clinic for a follow-up visit. Patient educated on when it is appropriate to go to the emergency department.   Monia Pouch, FNP-C [facility] [phone]

## 2021-09-07 LAB — COVID-19, FLU A+B AND RSV
Influenza A, NAA: NOT DETECTED
Influenza B, NAA: NOT DETECTED
RSV, NAA: NOT DETECTED
SARS-CoV-2, NAA: NOT DETECTED

## 2021-12-06 ENCOUNTER — Encounter: Payer: BC Managed Care – PPO | Admitting: Family Medicine

## 2021-12-06 ENCOUNTER — Encounter: Payer: Self-pay | Admitting: Family Medicine

## 2022-01-09 ENCOUNTER — Telehealth (INDEPENDENT_AMBULATORY_CARE_PROVIDER_SITE_OTHER): Payer: BC Managed Care – PPO | Admitting: Family Medicine

## 2022-01-09 ENCOUNTER — Encounter: Payer: Self-pay | Admitting: Family Medicine

## 2022-01-09 DIAGNOSIS — N946 Dysmenorrhea, unspecified: Secondary | ICD-10-CM

## 2022-01-09 MED ORDER — NAPROXEN 500 MG PO TABS: 500.0000 mg | ORAL_TABLET | Freq: Two times a day (BID) | ORAL | 0 refills | Status: DC

## 2022-01-09 NOTE — Progress Notes (Signed)
? ?  Virtual Visit  Note ?Due to COVID-19 pandemic this visit was conducted virtually. This visit type was conducted due to national recommendations for restrictions regarding the COVID-19 Pandemic (e.g. social distancing, sheltering in place) in an effort to limit this patient's exposure and mitigate transmission in our community. All issues noted in this document were discussed and addressed.  A physical exam was not performed with this format. ? ?I connected with Jennifer Key on 01/09/22 at 1635 by telephone and verified that I am speaking with the correct person using two identifiers. Jennifer Key is currently located at home and no one is currently with them during their visit. The provider, Gabriel Earing, FNP is located in their office at time of visit. ? ?I discussed the limitations, risks, security and privacy concerns of performing an evaluation and management service by telephone and the availability of in person appointments. I also discussed with the patient that there may be a patient responsible charge related to this service. The patient expressed understanding and agreed to proceed. ? ?CC: cramping ? ?History and Present Illness: ? ?HPI ?Jennifer Key reports painful menstrual cramping. They report intermittent nausea that started 3 days ago. They also have had diarrhea for the last 3 days. Reports 2 episodes a day. They then started their period today and have had painful cramping with this. Reports heavy bleeding but is not soaking through a maxi pad or tampon in a hour or less. Has a history of menorrhagia and dysmenorrhea but reports that it has been awhile since the pain was this bad. Denies fever, chills, or dizziness. Reports the pain has eased up a little. Has had bilateral tubal removal. Is not on contraception.  ? ? ? ?ROS ?As per HPI.  ? ?Observations/Objective: ?Alert and oriented x 3. Able to speak in full sentences without difficulty.  ? ?Assessment and Plan: ?Diagnoses and all orders  for this visit: ? ?Dysmenorrhea ?Naproxen as below. Discussed to follow up for in person evaluation for worsening symptoms or if symptoms persist.  ?-     naproxen (NAPROSYN) 500 MG tablet; Take 1 tablet (500 mg total) by mouth 2 (two) times daily with a meal. ? ? ? ? ?Follow Up Instructions: ?As needed.  ? ?  ?I discussed the assessment and treatment plan with the patient. The patient was provided an opportunity to ask questions and all were answered. The patient agreed with the plan and demonstrated an understanding of the instructions. ?  ?The patient was advised to call back or seek an in-person evaluation if the symptoms worsen or if the condition fails to improve as anticipated. ? ?The above assessment and management plan was discussed with the patient. The patient verbalized understanding of and has agreed to the management plan. Patient is aware to call the clinic if symptoms persist or worsen. Patient is aware when to return to the clinic for a follow-up visit. Patient educated on when it is appropriate to go to the emergency department.  ? ?Time call ended:  1646 ? ?I provided 11 minutes of  non face-to-face time during this encounter. ? ? ? ?Gabriel Earing, FNP ? ? ?

## 2022-04-01 ENCOUNTER — Encounter (INDEPENDENT_AMBULATORY_CARE_PROVIDER_SITE_OTHER): Payer: Self-pay

## 2022-05-24 ENCOUNTER — Encounter: Payer: Self-pay | Admitting: Family Medicine

## 2022-05-24 ENCOUNTER — Ambulatory Visit: Payer: BC Managed Care – PPO | Admitting: Family Medicine

## 2022-05-24 VITALS — BP 125/76 | HR 105 | Temp 98.3°F | Ht 67.0 in | Wt 174.0 lb

## 2022-05-24 DIAGNOSIS — J069 Acute upper respiratory infection, unspecified: Secondary | ICD-10-CM | POA: Diagnosis not present

## 2022-05-24 MED ORDER — PSEUDOEPH-BROMPHEN-DM 30-2-10 MG/5ML PO SYRP: 5.0000 mL | ORAL_SOLUTION | Freq: Four times a day (QID) | ORAL | 0 refills | Status: DC | PRN

## 2022-05-24 NOTE — Progress Notes (Signed)
Subjective:  Patient ID: Jennifer Key, adult    DOB: 1996-05-10, 26 y.o.   MRN: [id number]  Patient Care Team: Sonny Masters, FNP as PCP - General (Family Medicine)   Chief Complaint:  URI   HPI: Jennifer Key is a 26 y.o. adult presenting on 05/24/2022 for URI   Pt presents today with complaints of rhinorrhea, ear pressure, cough, congestion, sore throat, and lymphadenopathy.   URI  This is a new problem. The current episode started in the past 7 days. The problem has been waxing and waning. There has been no fever. Associated symptoms include congestion, coughing, a plugged ear sensation, rhinorrhea, a sore throat and swollen glands. Pertinent negatives include no abdominal pain, chest pain, diarrhea, dysuria, ear pain, headaches, joint pain, joint swelling, nausea, neck pain, rash, sinus pain, sneezing, vomiting or wheezing. Jennifer Shawnie Pons "[name]" has tried increased fluids for the symptoms. The treatment provided no relief.     Relevant past medical, surgical, family, and social history reviewed and updated as indicated.  Allergies and medications reviewed and updated. Data reviewed: Chart in Epic.   History reviewed. No pertinent past medical history.  Past Surgical History:  Procedure Laterality Date   ELBOW SURGERY Left    SALPINGECTOMY Bilateral     Social History   Socioeconomic History   Marital status: Single    Spouse name: Not on file   Number of children: 0   Years of education: Not on file   Highest education level: Not on file  Occupational History   Occupation: Engineer, maintenance (IT)  Tobacco Use   Smoking status: Never   Smokeless tobacco: Never  Substance and Sexual Activity   Alcohol use: No    Alcohol/week: 0.0 standard drinks of alcohol   Drug use: No   Sexual activity: Yes    Birth control/protection: Condom  Other Topics Concern   Not on file  Social History Narrative   Not on file   Social Determinants of Health   Financial Resource  Strain: Not on file  Food Insecurity: Not on file  Transportation Needs: Not on file  Physical Activity: Not on file  Stress: Not on file  Social Connections: Not on file  Intimate Partner Violence: Not on file    Outpatient Encounter Medications as of 05/24/2022  Medication Sig   brompheniramine-pseudoephedrine-DM 30-2-10 MG/5ML syrup Take 5 mLs by mouth 4 (four) times daily as needed.   [DISCONTINUED] naproxen (NAPROSYN) 500 MG tablet Take 1 tablet (500 mg total) by mouth 2 (two) times daily with a meal.   No facility-administered encounter medications on file as of 05/24/2022.    No Known Allergies  Review of Systems  Constitutional:  Positive for appetite change. Negative for activity change, chills, diaphoresis, fatigue, fever and unexpected weight change.  HENT:  Positive for congestion, postnasal drip, rhinorrhea and sore throat. Negative for dental problem, drooling, ear discharge, ear pain, facial swelling, hearing loss, mouth sores, nosebleeds, sinus pressure, sinus pain, sneezing, tinnitus, trouble swallowing and voice change.   Respiratory:  Positive for cough. Negative for apnea, choking, chest tightness, shortness of breath, wheezing and stridor.   Cardiovascular:  Negative for chest pain.  Gastrointestinal:  Negative for abdominal pain, diarrhea, nausea and vomiting.  Genitourinary:  Negative for decreased urine volume, difficulty urinating and dysuria.  Musculoskeletal:  Negative for joint pain and neck pain.  Skin:  Negative for rash.  Neurological:  Negative for dizziness, tremors, seizures, syncope, facial asymmetry, speech difficulty, weakness, light-headedness, numbness  and headaches.  Hematological:  Positive for adenopathy.  Psychiatric/Behavioral:  Negative for confusion.   All other systems reviewed and are negative.       Objective:  BP 125/76   Pulse (!) 105   Temp 98.3 F (36.8 C)   Ht 5\' 7"  (1.702 m)   Wt 174 lb (78.9 kg)   LMP 05/01/2022 (Exact  Date)   SpO2 94%   BMI 27.25 kg/m    Wt Readings from Last 3 Encounters:  05/24/22 174 lb (78.9 kg)  09/06/21 183 lb (83 kg)  08/07/18 158 lb (71.7 kg)    Physical Exam Vitals and nursing note reviewed.  Constitutional:      General: 08/09/18 "Jennifer Key" is not in acute distress.    Appearance: Normal appearance. Jennifer [name] "Shawnie Pons" is overweight. Jennifer [name] "Shawnie Pons" is not ill-appearing, toxic-appearing or diaphoretic.  HENT:     Head: Normocephalic and atraumatic.     Right Ear: Hearing, ear canal and external ear normal. A middle ear effusion is present. Tympanic membrane is not erythematous.     Left Ear: Hearing, ear canal and external ear normal. A middle ear effusion is present. Tympanic membrane is not erythematous.     Nose: Congestion present. No rhinorrhea.     Right Sinus: No maxillary sinus tenderness or frontal sinus tenderness.     Left Sinus: No maxillary sinus tenderness or frontal sinus tenderness.     Mouth/Throat:     Lips: Pink.     Mouth: Mucous membranes are moist.     Pharynx: Posterior oropharyngeal erythema present. No pharyngeal swelling, oropharyngeal exudate or uvula swelling.  Eyes:     Conjunctiva/sclera: Conjunctivae normal.     Pupils: Pupils are equal, round, and reactive to light.  Cardiovascular:     Rate and Rhythm: Normal rate and regular rhythm.     Heart sounds: Normal heart sounds.  Pulmonary:     Effort: Pulmonary effort is normal. No respiratory distress.     Breath sounds: Normal breath sounds. No stridor. No wheezing, rhonchi or rales.  Chest:     Chest wall: No tenderness.  Musculoskeletal:     Cervical back: Neck supple.     Right lower leg: No edema.     Left lower leg: No edema.  Lymphadenopathy:     Head:     Right side of head: Preauricular and posterior auricular adenopathy present. No occipital adenopathy.     Left side of head: Preauricular and posterior auricular adenopathy present. No occipital adenopathy.   Skin:    General: Skin is warm and dry.     Capillary Refill: Capillary refill takes less than 2 seconds.  Neurological:     General: No focal deficit present.     Mental Status: Jennifer Key "Jennifer Key" is alert and oriented to person, place, and time.  Psychiatric:        Mood and Affect: Mood normal.        Behavior: Behavior normal.        Thought Content: Thought content normal.        Judgment: Judgment normal.     Results for orders placed or performed in visit on 09/06/21  COVID-19, Flu A+B and RSV   Specimen: Nasopharyngeal(NP) swabs in vial transport medium  Result Value Ref Range   SARS-CoV-2, NAA Not Detected Not Detected   Influenza A, NAA Not Detected Not Detected   Influenza B, NAA Not Detected Not Detected   RSV, NAA Not Detected  Not Detected   Test Information: Comment        Pertinent labs & imaging results that were available during my care of the patient were reviewed by me and considered in my medical decision making.  Assessment & Plan:  Jennifer Key was seen today for uri.  Diagnoses and all orders for this visit:  URI with cough and congestion No indication of acute bacterial infection. Viral testing pending. Symptomatic care discussed in detail. Medications as prescribed. Report new, worsening, or persistent symptoms. Pt aware to report continued cervical lymphadenopathy.  -     brompheniramine-pseudoephedrine-DM 30-2-10 MG/5ML syrup; Take 5 mLs by mouth 4 (four) times daily as needed. -     COVID-19, Flu A+B and RSV     Continue all other maintenance medications.  Follow up plan: Return if symptoms worsen or fail to improve.   Continue healthy lifestyle choices, including diet (rich in fruits, vegetables, and lean proteins, and low in salt and simple carbohydrates) and exercise (at least 30 minutes of moderate physical activity daily).  Educational handout given for viral URI  The above assessment and management plan was discussed with the  patient. The patient verbalized understanding of and has agreed to the management plan. Patient is aware to call the clinic if they develop any new symptoms or if symptoms persist or worsen. Patient is aware when to return to the clinic for a follow-up visit. Patient educated on when it is appropriate to go to the emergency department.   Kari Baars, FNP-C [facility] [phone]

## 2022-05-25 LAB — COVID-19, FLU A+B AND RSV
Influenza A, NAA: NOT DETECTED
Influenza B, NAA: NOT DETECTED
RSV, NAA: NOT DETECTED
SARS-CoV-2, NAA: DETECTED — AB

## 2022-07-11 ENCOUNTER — Encounter: Payer: BC Managed Care – PPO | Admitting: Family Medicine

## 2022-08-23 ENCOUNTER — Ambulatory Visit: Payer: BC Managed Care – PPO | Admitting: Family Medicine

## 2022-08-23 ENCOUNTER — Encounter: Payer: Self-pay | Admitting: Family Medicine

## 2022-08-23 VITALS — BP 120/74 | HR 93 | Temp 97.6°F | Ht 67.0 in | Wt 176.4 lb

## 2022-08-23 DIAGNOSIS — M25562 Pain in left knee: Secondary | ICD-10-CM | POA: Diagnosis not present

## 2022-08-23 MED ORDER — NAPROXEN 500 MG PO TABS: 500.0000 mg | ORAL_TABLET | Freq: Two times a day (BID) | ORAL | 0 refills | Status: AC

## 2022-08-23 NOTE — Progress Notes (Signed)
Subjective:  Patient ID: Jennifer Key, adult    DOB: 10-Oct-1995, 26 y.o.   MRN: [id number]  Patient Care Team: Baruch Gouty, FNP as PCP - General (Family Medicine)   Chief Complaint:  Knee Pain (Left x 3 weeks )   HPI: Jennifer Key is a 26 y.o. adult presenting on 08/23/2022 for Knee Pain (Left x 3 weeks )   Knee Pain  The incident occurred more than 1 week ago (3 weeks ago). There was no injury mechanism. The pain is present in the left knee. The quality of the pain is described as burning. The pain is at a severity of 4/10. The pain is moderate. The pain has been Fluctuating since onset. Pertinent negatives include no inability to bear weight, loss of motion, loss of sensation, muscle weakness, numbness or tingling. Jennifer Key "Jennifer Key" reports no foreign bodies present. The symptoms are aggravated by movement, palpation and weight bearing. Jennifer Key "Jennifer Key" has tried nothing for the symptoms.      Relevant past medical, surgical, family, and social history reviewed and updated as indicated.  Allergies and medications reviewed and updated. Data reviewed: Chart in Epic.   History reviewed. No pertinent past medical history.  Past Surgical History:  Procedure Laterality Date   ELBOW SURGERY Left    SALPINGECTOMY Bilateral     Social History   Socioeconomic History   Marital status: Single    Spouse name: Not on file   Number of children: 0   Years of education: Not on file   Highest education level: Not on file  Occupational History   Occupation: Physiological scientist  Tobacco Use   Smoking status: Never   Smokeless tobacco: Never  Substance and Sexual Activity   Alcohol use: No    Alcohol/week: 0.0 standard drinks of alcohol   Drug use: No   Sexual activity: Yes    Birth control/protection: Condom  Other Topics Concern   Not on file  Social History Narrative   Not on file   Social Determinants of Health   Financial Resource Strain: Not on file  Food  Insecurity: Not on file  Transportation Needs: Not on file  Physical Activity: Not on file  Stress: Not on file  Social Connections: Not on file  Intimate Partner Violence: Not on file    Outpatient Encounter Medications as of 08/23/2022  Medication Sig   [DISCONTINUED] brompheniramine-pseudoephedrine-DM 30-2-10 MG/5ML syrup Take 5 mLs by mouth 4 (four) times daily as needed.   No facility-administered encounter medications on file as of 08/23/2022.    No Known Allergies  Review of Systems  Constitutional:  Negative for activity change.  Respiratory:  Negative for shortness of breath.   Musculoskeletal:  Positive for arthralgias.       L knee pain  Neurological:  Negative for tingling and numbness.  All other systems reviewed and are negative.       Objective:  BP 120/74   Pulse 93   Temp 97.6 F (36.4 C) (Temporal)   Ht 5\' 7"  (1.702 m)   Wt 176 lb 6.4 oz (80 kg)   LMP 08/23/2022   SpO2 96%   BMI 27.63 kg/m    Wt Readings from Last 3 Encounters:  08/23/22 176 lb 6.4 oz (80 kg)  05/24/22 174 lb (78.9 kg)  09/06/21 183 lb (83 kg)    Physical Exam Vitals and nursing note reviewed.  Constitutional:      General: Jennifer Key "Jennifer Key" is not in  acute distress.    Appearance: Normal appearance. Montez Shawnie Key "Jennifer Key" is well-developed.  Cardiovascular:     Rate and Rhythm: Normal rate.  Pulmonary:     Effort: Pulmonary effort is normal. No respiratory distress.  Musculoskeletal:        General: No deformity or signs of injury.     Right knee: Normal.     Left knee: Effusion and erythema present. No swelling, deformity, bony tenderness or crepitus. Decreased range of motion. Tenderness present over the MCL. Normal alignment. Normal pulse.     Comments: Pain with flexion  Skin:    General: Skin is warm and dry.  Neurological:     General: No focal deficit present.     Mental Status: Jennifer Key "Jennifer Key" is alert and oriented to person, place, and time.   Psychiatric:        Mood and Affect: Mood normal.        Behavior: Behavior normal.     Results for orders placed or performed in visit on 05/24/22  COVID-19, Flu A+B and RSV   Specimen: Nasopharyngeal(NP) swabs in vial transport medium  Result Value Ref Range   SARS-CoV-2, NAA Detected (A) Not Detected   Influenza A, NAA Not Detected Not Detected   Influenza B, NAA Not Detected Not Detected   RSV, NAA Not Detected Not Detected   Test Information: Comment        Pertinent labs & imaging results that were available during my care of the patient were reviewed by me and considered in my medical decision making.  Assessment & Plan:  Jennifer Key was seen today for knee pain.  Diagnoses and all orders for this visit:  Acute pain of left knee -     naproxen (NAPROSYN) 500 MG tablet; Take 1 tablet (500 mg total) by mouth 2 (two) times daily with a meal for 14 days.  Meds as prescribed Wear brace Rest Ice  Compression Elevate If no improvement in 2 weeks, will refer for PT.  The above assessment and management plan was discussed with the patient. The patient verbalized understanding of and has agreed to the management plan. Patient is aware to call the clinic if they develop any new symptoms or if symptoms persist or worsen. Patient is aware when to return to the clinic for a follow-up visit. Patient educated on when it is appropriate to go to the emergency department.   Jennifer Bossier, FNP student   I personally was present during the history, physical exam, and medical decision-making activities of this visit and have verified that the services and findings are accurately documented in the nurse practitioner student's note.  Kari Baars, FNP-C [facility] [address] [place], [place] [address] [phone]

## 2023-05-23 ENCOUNTER — Ambulatory Visit: Payer: BC Managed Care – PPO

## 2023-05-23 VITALS — BP 134/76 | HR 73 | Temp 97.9°F | Ht 67.0 in | Wt 174.4 lb

## 2023-05-23 DIAGNOSIS — Z808 Family history of malignant neoplasm of other organs or systems: Secondary | ICD-10-CM

## 2023-05-23 DIAGNOSIS — D229 Melanocytic nevi, unspecified: Secondary | ICD-10-CM

## 2023-05-23 NOTE — Progress Notes (Signed)
Subjective:  Patient ID: Jennifer Key, adult    DOB: [birth year], 27 y.o.   MRN: [id number]  Patient Care Team: Sonny Masters, FNP as PCP - General (Family Medicine)   Chief Complaint:  Nevus (Patient states she noticed a mole on her back yesterday. )   HPI: Jennifer Key is a 27 y.o. adult presenting on 05/23/2023 for Nevus (Patient states she noticed a mole on her back yesterday. )   Pt presents today for evaluation of a mole on her back that is changing in size and color. No other associated symptoms. Grandmother did have malignant melanoma. She does report sunscreen use.      Relevant past medical, surgical, family, and social history reviewed and updated as indicated.  Allergies and medications reviewed and updated. Data reviewed: Chart in Epic.   History reviewed. No pertinent past medical history.  Past Surgical History:  Procedure Laterality Date   ELBOW SURGERY Left    SALPINGECTOMY Bilateral     Social History   Socioeconomic History   Marital status: Single    Spouse name: Not on file   Number of children: 0   Years of education: Not on file   Highest education level: 12th grade  Occupational History   Occupation: Engineer, maintenance (IT)  Tobacco Use   Smoking status: Never   Smokeless tobacco: Never  Substance and Sexual Activity   Alcohol use: No    Alcohol/week: 0.0 standard drinks of alcohol   Drug use: No   Sexual activity: Yes    Birth control/protection: Condom  Other Topics Concern   Not on file  Social History Narrative   Not on file   Social Determinants of Health   Financial Resource Strain: Medium Risk (05/23/2023)   Overall Financial Resource Strain (CARDIA)    Difficulty of Paying Living Expenses: Somewhat hard  Food Insecurity: No Food Insecurity (05/23/2023)   Hunger Vital Sign    Worried About Running Out of Food in the Last Year: Never true    Ran Out of Food in the Last Year: Never true  Transportation Needs: No Transportation  Needs (05/23/2023)   PRAPARE - Administrator, Civil Service (Medical): No    Lack of Transportation (Non-Medical): No  Physical Activity: Sufficiently Active (05/23/2023)   Exercise Vital Sign    Days of Exercise per Week: 4 days    Minutes of Exercise per Session: 40 min  Stress: Stress Concern Present (05/23/2023)   [employer] of Occupational Health - Occupational Stress Questionnaire    Feeling of Stress : To some extent  Social Connections: Unknown (05/23/2023)   Social Connection and Isolation Panel [NHANES]    Frequency of Communication with Friends and Family: Once a week    Frequency of Social Gatherings with Friends and Family: Patient declined    Attends Religious Services: Never    Database administrator or Organizations: No    Attends Banker Meetings: Not on file    Marital Status: Living with partner  Intimate Partner Violence: Not on file    No outpatient encounter medications on file as of 05/23/2023.   No facility-administered encounter medications on file as of 05/23/2023.    No Known Allergies  Review of Systems  Constitutional:  Negative for activity change, appetite change, chills, diaphoresis, fatigue, fever and unexpected weight change.  HENT: Negative.    Eyes: Negative.   Respiratory:  Negative for cough, chest tightness and shortness of breath.  Cardiovascular:  Negative for chest pain, palpitations and leg swelling.  Gastrointestinal:  Negative for blood in stool, constipation, diarrhea, nausea and vomiting.  Endocrine: Negative.   Genitourinary:  Negative for dysuria, frequency and urgency.  Musculoskeletal:  Negative for arthralgias and myalgias.  Skin:  Positive for color change.  Allergic/Immunologic: Negative.   Neurological:  Negative for dizziness and headaches.  Hematological: Negative.   Psychiatric/Behavioral:  Negative for confusion, hallucinations, sleep disturbance and suicidal ideas.   All other systems  reviewed and are negative.       Objective:  BP 134/76   Pulse 73   Temp 97.9 F (36.6 C) (Temporal)   Ht 5\' 7"  (1.702 m)   Wt 174 lb 6.4 oz (79.1 kg)   SpO2 98%   BMI 27.31 kg/m    Wt Readings from Last 3 Encounters:  05/23/23 174 lb 6.4 oz (79.1 kg)  08/23/22 176 lb 6.4 oz (80 kg)  05/24/22 174 lb (78.9 kg)    Physical Exam Vitals and nursing note reviewed.  Constitutional:      General: Jennifer Key is not in acute distress.    Appearance: Normal appearance. Jennifer Key is overweight. Jennifer Key is not ill-appearing, toxic-appearing or diaphoretic.  HENT:     Head: Normocephalic and atraumatic.     Mouth/Throat:     Mouth: Mucous membranes are moist.     Pharynx: Oropharynx is clear.  Eyes:     Conjunctiva/sclera: Conjunctivae normal.     Pupils: Pupils are equal, round, and reactive to light.  Cardiovascular:     Rate and Rhythm: Normal rate and regular rhythm.  Pulmonary:     Effort: Pulmonary effort is normal.     Breath sounds: Normal breath sounds.  Musculoskeletal:     Cervical back: Normal range of motion and neck supple.  Skin:    General: Skin is warm and dry.     Capillary Refill: Capillary refill takes less than 2 seconds.     Findings: Lesion and rash present. Rash is papular.       Neurological:     General: No focal deficit present.     Mental Status: Jennifer Key is alert and oriented to person, place, and time.  Psychiatric:        Mood and Affect: Mood normal.        Behavior: Behavior normal.        Thought Content: Thought content normal.        Judgment: Judgment normal.     Results for orders placed or performed in visit on 05/24/22  COVID-19, Flu A+B and RSV   Specimen: Nasopharyngeal(NP) swabs in vial transport medium  Result Value Ref Range   SARS-CoV-2, NAA Detected (A) Not Detected   Influenza A, NAA Not Detected Not Detected   Influenza B, NAA Not Detected Not Detected   RSV, NAA Not Detected Not Detected   Test Information: Comment         Pertinent labs & imaging results that were available during my care of the patient were reviewed by me and considered in my medical decision making.  Assessment & Plan:  Jennifer "Jennifer Key" was seen today for nevus.  Diagnoses and all orders for this visit:  Atypical mole Family history of malignant melanoma of skin Will refer to dermatology for evaluation.  -     Ambulatory referral to Dermatology     Continue all other maintenance medications.  Follow up plan: Return if symptoms worsen or fail to improve.   Continue  healthy lifestyle choices, including diet (rich in fruits, vegetables, and lean proteins, and low in salt and simple carbohydrates) and exercise (at least 30 minutes of moderate physical activity daily).   The above assessment and management plan was discussed with the patient. The patient verbalized understanding of and has agreed to the management plan. Patient is aware to call the clinic if they develop any new symptoms or if symptoms persist or worsen. Patient is aware when to return to the clinic for a follow-up visit. Patient educated on when it is appropriate to go to the emergency department.   Kari Baars, FNP-C [facility] [phone]

## 2023-08-23 ENCOUNTER — Telehealth: Payer: Self-pay | Admitting: Family Medicine

## 2023-08-23 NOTE — Telephone Encounter (Signed)
Called patient back to schedule appt with Neysa Bonito states she was just going to go to urgent care   Copied from CRM [id number]. Topic: Clinical - Medical Advice >> Aug 23, 2023  1:13 PM Almira Coaster wrote: Reason for CRM: Patient called with symptoms of fever, chills, cough. Offered an appt for tomorrow but patient wanted to be seen today.
